# Patient Record
Sex: Female | Born: 1955
Health system: Southern US, Community
[De-identification: ages and names within clinical notes are randomized; demographics above are authoritative.]

## PROBLEM LIST (undated history)

## (undated) DIAGNOSIS — I10 Essential (primary) hypertension: Secondary | ICD-10-CM

---

## 1999-11-18 ENCOUNTER — Other Ambulatory Visit: Admission: RE | Admit: 1999-11-18 | Discharge: 1999-11-18 | Payer: Self-pay | Admitting: Family Medicine

## 2004-01-20 ENCOUNTER — Ambulatory Visit (HOSPITAL_COMMUNITY): Admission: RE | Admit: 2004-01-20 | Discharge: 2004-01-20 | Payer: Self-pay | Admitting: Gastroenterology

## 2004-01-20 ENCOUNTER — Encounter (INDEPENDENT_AMBULATORY_CARE_PROVIDER_SITE_OTHER): Payer: Self-pay | Admitting: *Deleted

## 2006-02-17 ENCOUNTER — Emergency Department (HOSPITAL_COMMUNITY): Admission: EM | Admit: 2006-02-17 | Discharge: 2006-02-17 | Payer: Self-pay | Admitting: Family Medicine

## 2007-03-01 ENCOUNTER — Emergency Department (HOSPITAL_COMMUNITY): Admission: EM | Admit: 2007-03-01 | Discharge: 2007-03-01 | Payer: Self-pay | Admitting: Emergency Medicine

## 2010-08-13 NOTE — Op Note (Signed)
Caitlin Lawson, Caitlin Lawson                  ACCOUNT NO.:  [id number]   MEDICAL RECORD NO.:  [id number]          PATIENT TYPE:  AMB   LOCATION:  ENDO                         FACILITY:  [facility]   PHYSICIAN:  Jordan Hawks. Elnoria Howard, MD    DATE OF BIRTH:  Nov 16, 1955   DATE OF PROCEDURE:  01/20/2004  DATE OF DISCHARGE:                                 OPERATIVE REPORT   PROCEDURE:  Colonoscopy.   ENDOSCOPIST:  Jordan Hawks. Elnoria Howard, M.D.   INSTRUMENT USED:  Olympus adult video colonoscope.   INDICATIONS:  Rectal bleeding and iron deficiency anemia.   CONSENT:  Informed consent was obtained from the patient describing the  risks of bleeding, infection, medication reaction, the 10% missed rate for a  small colon cancer and death, all of which are not exclusive of any other  complications that can occur.   PREPROCEDURE PHYSICAL EXAMINATION:  LUNGS:  Clear to auscultation  bilaterally.  CARDIOVASCULAR:  Regular rate and rhythm.  ABDOMEN:  Obese, soft, nontender, nondistended.   MEDICATIONS:  Versed 10 mg IV, Demerol 100 mg IV.   DESCRIPTION OF PROCEDURE:  After adequate sedation was achieved, while in  the left lateral decubitus position, a rectal examination was performed. On  inspection of the external anal canal, the patient was noted to have several  large anal skin tags.  The rectal examination was negative for any palpable  abnormalities.  The colonoscope was then advanced under direct visualization  to the terminal ileum and further documentation of the terminal ileum and  cecum were obtained.  Upon withdrawal of the colonoscope, the patient is  noted to have an excellent prep.  While at the hepatic flexure, a 3 mm  sessile polyp was identified and a cold biopsy was obtained.  Throughout the  entire colon, there was no evidence of any masses, polyps, inflammation,  ulcerations, erosions, vascular abnormalities or diverticulosis aside from  the polyp that was just described at the hepatic flexure.  Upon  withdrawal  of the colonoscope into the rectum, with retroflexion, the patient was noted  to have mild external hemorrhoids but no other abnormalities were detected.  The colonoscope was then straightened and withdrawn from the patient and the  procedure was terminated.  No complications were encountered.  The patient  was noted to have mild external hemorrhoids but no other abnormalities were  detected.  The colonoscope was then straightened and withdrawn from  the patient and the procedure was terminated.  No complications were  encountered.  The patient tolerated the procedure well.  The plan at this  time is to follow up on biopsies and to proceed with an EGD for iron  deficiency anemia.       PDH/MEDQ  D:  01/20/2004  T:  01/20/2004  Job:  [id number]   cc:   Talmadge Coventry, M.D.  [address]  [place]  [place] [address]  Fax: [phone]

## 2010-08-13 NOTE — Op Note (Signed)
Caitlin Lawson                  ACCOUNT NO.:  [id number]   MEDICAL RECORD NO.:  [id number]          PATIENT TYPE:  AMB   LOCATION:  ENDO                         FACILITY:  [facility]   PHYSICIAN:  Jordan Hawks. Elnoria Howard, MD    DATE OF BIRTH:  1956-02-16   DATE OF PROCEDURE:  01/20/2004  DATE OF DISCHARGE:                                 OPERATIVE REPORT   PROCEDURE:  Esophagogastroduodenoscopy.   ENDOSCOPIST:  Jordan Hawks. Elnoria Howard, M.D.   INDICATION:  Iron deficiency anemia.   INFORMED CONSENT:  Consent was obtained describing the risks of bleeding,  infection, perforation, medication reactions and the risk of death.   EQUIPMENT USED:  An adult Olympus endoscope.   PHYSICAL EXAM:  Please see the colonoscopy report.   MEDICATIONS:  None used during this procedure.   PROCEDURE:  After the colonoscopy was performed, the patient was positioned  for the upper endoscopy.  The endoscope was advanced from the oral cavity  into the proximal duodenum under direct visualization.  Several small random  biopsies were obtained of the duodenum and upon withdrawal of the endoscope  into the duodenal bulb, the patient was noted to have duodenitis as well as  a healing duodenal bulb ulcer.  There was no evidence of any bleeding.  The  endoscope was then withdrawn into the gastric lumen and there were no  abnormalities detected in the gastric mucosa, no evidence of any overt  inflammation, ulcerations, masses or vascular abnormalities.  Retroflexion  was negative for any abnormalities in the cardia region.  The endoscope was  then straightened and withdrawn into the esophagus.  The patient was noted  to have an irregular Z-line at 40 cm.  There was no evidence of active  esophagitis.  The endoscope was then withdrawn from the patient and the  procedure was terminated.  The patient tolerated the procedure well and no  complications were encountered.   PLAN:  Plan at this time is to follow up on the small  bowel biopsies and  also the CLO biopsies that were obtained in the stomach, and there may be  consideration for a repeat endoscopy in regards to evaluation of possible  Barrett's esophagus.  The patient will most likely require to be on high-  dose PPI for a duration before a repeat endoscopy can be performed.       PDH/MEDQ  D:  01/20/2004  T:  01/20/2004  Job:  [id number]   cc:   Talmadge Coventry, M.D.  [address]  [place]  [place] [address]  Fax: [phone]

## 2012-11-07 ENCOUNTER — Other Ambulatory Visit: Payer: Self-pay | Admitting: Family Medicine

## 2012-11-07 ENCOUNTER — Other Ambulatory Visit (HOSPITAL_COMMUNITY)
Admission: RE | Admit: 2012-11-07 | Discharge: 2012-11-07 | Disposition: A | Discharge status: Home / Self Care | Payer: 59 | Source: Ambulatory Visit | Attending: Family Medicine | Admitting: Family Medicine

## 2012-11-07 DIAGNOSIS — Z01419 Encounter for gynecological examination (general) (routine) without abnormal findings: Secondary | ICD-10-CM | POA: Insufficient documentation

## 2012-11-07 DIAGNOSIS — Z1151 Encounter for screening for human papillomavirus (HPV): Secondary | ICD-10-CM | POA: Insufficient documentation

## 2013-04-08 ENCOUNTER — Other Ambulatory Visit: Payer: Self-pay | Admitting: Family Medicine

## 2013-04-08 ENCOUNTER — Ambulatory Visit
Admission: RE | Admit: 2013-04-08 | Discharge: 2013-04-08 | Disposition: A | Discharge status: Home / Self Care | Payer: 59 | Source: Ambulatory Visit | Attending: Family Medicine | Admitting: Family Medicine

## 2013-04-08 DIAGNOSIS — M1250 Traumatic arthropathy, unspecified site: Secondary | ICD-10-CM

## 2013-11-12 ENCOUNTER — Other Ambulatory Visit (HOSPITAL_COMMUNITY)
Admission: RE | Admit: 2013-11-12 | Discharge: 2013-11-12 | Disposition: A | Discharge status: Home / Self Care | Payer: 59 | Source: Ambulatory Visit | Attending: Family Medicine | Admitting: Family Medicine

## 2013-11-12 ENCOUNTER — Other Ambulatory Visit: Payer: Self-pay | Admitting: Family Medicine

## 2013-11-12 DIAGNOSIS — Z1151 Encounter for screening for human papillomavirus (HPV): Secondary | ICD-10-CM | POA: Diagnosis present

## 2013-11-12 DIAGNOSIS — Z01419 Encounter for gynecological examination (general) (routine) without abnormal findings: Secondary | ICD-10-CM | POA: Insufficient documentation

## 2013-11-13 LAB — CYTOLOGY - PAP

## 2014-07-06 ENCOUNTER — Ambulatory Visit (HOSPITAL_BASED_OUTPATIENT_CLINIC_OR_DEPARTMENT_OTHER): Payer: 59 | Attending: Family Medicine

## 2014-07-06 VITALS — Ht 62.0 in | Wt 245.0 lb

## 2014-07-06 DIAGNOSIS — G4733 Obstructive sleep apnea (adult) (pediatric): Secondary | ICD-10-CM | POA: Diagnosis not present

## 2014-07-06 DIAGNOSIS — R0683 Snoring: Secondary | ICD-10-CM | POA: Diagnosis not present

## 2014-07-06 DIAGNOSIS — I119 Hypertensive heart disease without heart failure: Secondary | ICD-10-CM

## 2014-07-06 DIAGNOSIS — G4710 Hypersomnia, unspecified: Secondary | ICD-10-CM | POA: Diagnosis present

## 2014-07-12 DIAGNOSIS — I119 Hypertensive heart disease without heart failure: Secondary | ICD-10-CM | POA: Diagnosis not present

## 2014-07-12 NOTE — Sleep Study (Signed)
   NAME: Caitlin PetrinRosa L Degen DATE OF BIRTH:  [birth year] MEDICAL RECORD NUMBER [id number]  LOCATION: [facility]  PHYSICIAN: YOUNG,CLINTON D  DATE OF STUDY: 07/06/2014  SLEEP STUDY TYPE: Nocturnal Polysomnogram               REFERRING PHYSICIAN: Renaye RakersBland, Veita, MD  INDICATION FOR STUDY: Hypersomnia with sleep apnea  EPWORTH SLEEPINESS SCORE:   4/24 HEIGHT: 5\' 2"  (157.5 cm)  WEIGHT: 245 lb (111.131 kg)    Body mass index is 44.8 kg/(m^2).  NECK SIZE: 16 in.  MEDICATIONS: Charted for review  SLEEP ARCHITECTURE: Total sleep time 355 minutes with sleep efficiency 95.7%. Stage I was 4.5%, stage II 83.9%, stage III 0.1%, REM 11.4% of total sleep time. Sleep latency 7 minutes, REM latency 134 minutes, awake after sleep onset 9 minutes, arousal index 0.8, bedtime medication: None  RESPIRATORY DATA: Apnea hypopnea index (AHI) 9.3 per hour. 55 total events scored including 2 obstructive apneas and 53 hypopneas. All events were while supine. REM AHI 43 per hour. There were not enough early events to permit application of split protocol CPAP titration.  OXYGEN DATA: Loud snoring with oxygen desaturation to a nadir of 75% and mean saturation 91.6% on room air  CARDIAC DATA: Sinus rhythm with occasional PAC  MOVEMENT/PARASOMNIA: No significant movement disturbance, no bathroom trips  IMPRESSION/ RECOMMENDATION:   1) Mild obstructive sleep apnea/hypopnea syndrome, AHI 9.3 per hour with supine events. REM AHI 43 per hour. Loud snoring with oxygen desaturation to a nadir of 75% and mean saturation 91.6% on room air. 2) Scores in this range may respond to conservative therapy including weight loss and encouragement to sleep off flat of back. On an individual basis, CPAP or an oral appliance may be appropriate. 3) The patient reported waking up choking from sleep in the home environment. This is not a common description for sleep apnea and suggests possible occasional reflux during sleep.    Waymon BudgeYOUNG,CLINTON D Diplomate, American Board of Sleep Medicine  ELECTRONICALLY SIGNED ON:  07/12/2014, 10:27 AM [facility] PH: [phone]   FX: [phone] ACCREDITED BY THE AMERICAN ACADEMY OF SLEEP MEDICINE

## 2016-05-19 DIAGNOSIS — H2513 Age-related nuclear cataract, bilateral: Secondary | ICD-10-CM | POA: Diagnosis not present

## 2016-05-19 DIAGNOSIS — H401122 Primary open-angle glaucoma, left eye, moderate stage: Secondary | ICD-10-CM | POA: Diagnosis not present

## 2016-05-19 DIAGNOSIS — H40011 Open angle with borderline findings, low risk, right eye: Secondary | ICD-10-CM | POA: Diagnosis not present

## 2016-05-28 DIAGNOSIS — E785 Hyperlipidemia, unspecified: Secondary | ICD-10-CM | POA: Diagnosis not present

## 2016-05-28 DIAGNOSIS — R7309 Other abnormal glucose: Secondary | ICD-10-CM | POA: Diagnosis not present

## 2016-05-28 DIAGNOSIS — I10 Essential (primary) hypertension: Secondary | ICD-10-CM | POA: Diagnosis not present

## 2016-06-02 DIAGNOSIS — H401122 Primary open-angle glaucoma, left eye, moderate stage: Secondary | ICD-10-CM | POA: Diagnosis not present

## 2016-07-18 DIAGNOSIS — H2513 Age-related nuclear cataract, bilateral: Secondary | ICD-10-CM | POA: Diagnosis not present

## 2016-07-18 DIAGNOSIS — H40011 Open angle with borderline findings, low risk, right eye: Secondary | ICD-10-CM | POA: Diagnosis not present

## 2016-07-18 DIAGNOSIS — H401122 Primary open-angle glaucoma, left eye, moderate stage: Secondary | ICD-10-CM | POA: Diagnosis not present

## 2016-10-19 DIAGNOSIS — Z1231 Encounter for screening mammogram for malignant neoplasm of breast: Secondary | ICD-10-CM | POA: Diagnosis not present

## 2016-12-31 DIAGNOSIS — Z23 Encounter for immunization: Secondary | ICD-10-CM | POA: Diagnosis not present

## 2017-01-17 DIAGNOSIS — H40011 Open angle with borderline findings, low risk, right eye: Secondary | ICD-10-CM | POA: Diagnosis not present

## 2017-01-17 DIAGNOSIS — H2513 Age-related nuclear cataract, bilateral: Secondary | ICD-10-CM | POA: Diagnosis not present

## 2017-01-17 DIAGNOSIS — H401122 Primary open-angle glaucoma, left eye, moderate stage: Secondary | ICD-10-CM | POA: Diagnosis not present

## 2017-04-15 DIAGNOSIS — E785 Hyperlipidemia, unspecified: Secondary | ICD-10-CM | POA: Diagnosis not present

## 2017-04-15 DIAGNOSIS — I10 Essential (primary) hypertension: Secondary | ICD-10-CM | POA: Diagnosis not present

## 2017-04-15 DIAGNOSIS — R7309 Other abnormal glucose: Secondary | ICD-10-CM | POA: Diagnosis not present

## 2017-06-19 DIAGNOSIS — M25561 Pain in right knee: Secondary | ICD-10-CM | POA: Diagnosis not present

## 2017-07-21 DIAGNOSIS — H2513 Age-related nuclear cataract, bilateral: Secondary | ICD-10-CM | POA: Diagnosis not present

## 2017-07-21 DIAGNOSIS — E785 Hyperlipidemia, unspecified: Secondary | ICD-10-CM | POA: Diagnosis not present

## 2017-07-21 DIAGNOSIS — H40011 Open angle with borderline findings, low risk, right eye: Secondary | ICD-10-CM | POA: Diagnosis not present

## 2017-07-21 DIAGNOSIS — H401122 Primary open-angle glaucoma, left eye, moderate stage: Secondary | ICD-10-CM | POA: Diagnosis not present

## 2017-07-21 DIAGNOSIS — R7309 Other abnormal glucose: Secondary | ICD-10-CM | POA: Diagnosis not present

## 2017-07-21 DIAGNOSIS — I10 Essential (primary) hypertension: Secondary | ICD-10-CM | POA: Diagnosis not present

## 2017-07-29 DIAGNOSIS — I10 Essential (primary) hypertension: Secondary | ICD-10-CM | POA: Diagnosis not present

## 2017-07-29 DIAGNOSIS — E785 Hyperlipidemia, unspecified: Secondary | ICD-10-CM | POA: Diagnosis not present

## 2017-07-29 DIAGNOSIS — M179 Osteoarthritis of knee, unspecified: Secondary | ICD-10-CM | POA: Diagnosis not present

## 2017-08-16 DIAGNOSIS — M1712 Unilateral primary osteoarthritis, left knee: Secondary | ICD-10-CM | POA: Diagnosis not present

## 2017-08-16 DIAGNOSIS — M1711 Unilateral primary osteoarthritis, right knee: Secondary | ICD-10-CM | POA: Diagnosis not present

## 2017-09-14 ENCOUNTER — Encounter (HOSPITAL_COMMUNITY): Payer: Self-pay | Admitting: Family Medicine

## 2017-09-14 ENCOUNTER — Ambulatory Visit (HOSPITAL_COMMUNITY)
Admission: EM | Admit: 2017-09-14 | Discharge: 2017-09-14 | Disposition: A | Discharge status: Home / Self Care | Payer: 59 | Attending: Family Medicine | Admitting: Family Medicine

## 2017-09-14 DIAGNOSIS — M25561 Pain in right knee: Secondary | ICD-10-CM | POA: Diagnosis not present

## 2017-09-14 HISTORY — DX: Essential (primary) hypertension: I10

## 2017-09-14 NOTE — Discharge Instructions (Addendum)
Start knee brace during activity. Physical therapy for strengthening thigh muscle.

## 2017-09-14 NOTE — ED Triage Notes (Signed)
Pt here for right knee pain and swelling . She received a cortisone shot from ortho in April. sts worse since the injection. She has been taking meloxicam also and helped.

## 2017-09-14 NOTE — ED Provider Notes (Signed)
[facility]    CSN: [id number] Arrival date & time: 09/14/17  1730     History   Chief Complaint Chief Complaint  Patient presents with  . Knee Pain    HPI Caitlin Lawson is a 62 y.o. female.   62 year old female comes in for right knee pain and swelling.  States that she was given a cortisone injection by Ortho in April which did not improve symptoms, and felt that it has worsened.  Pain is general to the right knee, worse with going up and down the stairs.  States she then tried meloxicam for about a month, with slight relief.  She was told to discontinue meloxicam given no significant improvement.  She denies injury/trauma.  States had x-ray showing arthritis.  She has continued to use topical analgesics without relief.  Denies erythema, increased warmth, fever.     Past Medical History:  Diagnosis Date  . Hypertension     There are no active problems to display for this patient.   History reviewed. No pertinent surgical history.  OB History   None      Home Medications    Prior to Admission medications   Medication Sig Start Date End Date Taking? Authorizing Provider  Olmesartan-amLODIPine-HCTZ (TRIBENZOR) 40-10-12.5 MG TABS Take by mouth.   Yes [provider]    Family History History reviewed. No pertinent family history.  Social History Social History   Tobacco Use  . Smoking status: Never Smoker  . Smokeless tobacco: Never Used  Substance Use Topics  . Alcohol use: Not on file  . Drug use: Not on file     Allergies   Patient has no known allergies.   Review of Systems Review of Systems  Reason unable to perform ROS: See HPI as above.     Physical Exam Triage Vital Signs ED Triage Vitals  Enc Vitals Group     BP 09/14/17 1753 126/84     Pulse Rate 09/14/17 1753 76     Resp 09/14/17 1753 18     Temp 09/14/17 1753 98.6 F (37 C)     Temp src --      SpO2 09/14/17 1753 98 %     Weight --      Height --    Head Circumference --      Peak Flow --      Pain Score 09/14/17 1752 4     Pain Loc --      Pain Edu? --      Excl. in GC? --    No data found.  Updated Vital Signs BP 126/84   Pulse 76   Temp 98.6 F (37 C)   Resp 18   LMP 03/25/2013   SpO2 98%   Visual Acuity Right Eye Distance:   Left Eye Distance:   Bilateral Distance:    Right Eye Near:   Left Eye Near:    Bilateral Near:     Physical Exam  Constitutional: She is oriented to person, place, and time. She appears well-developed and well-nourished. No distress.  HENT:  Head: Normocephalic and atraumatic.  Eyes: Pupils are equal, round, and reactive to light. Conjunctivae are normal.  Musculoskeletal:  No obvious swelling, erythema, increased warmth, contusion. No obvious tenderness to palpation of the knee. Full ROM of knee, with crepitus felt. Strength normal and equal bilaterally. Sensation intact and equal bilaterally. Tenderness when pressure applied to patellar while flexing hip.   Neurological: She is alert and  oriented to person, place, and time.     UC Treatments / Results  Labs (all labs ordered are listed, but only abnormal results are displayed) Labs Reviewed - No data to display  EKG None  Radiology No results found.  Procedures Procedures (including critical care time)  Medications Ordered in UC Medications - No data to display  Initial Impression / Assessment and Plan / UC Course  I have reviewed the triage vital signs and the nursing notes.  Pertinent labs & imaging results that were available during my care of the patient were reviewed by me and considered in my medical decision making (see chart for details).    Discussed case with Dr Milus GlazierLauenstein who also examined patient.  Patient exam suspicious for patellofemoral syndrome.  Will have patient wear knee brace, follow-up with physical therapy for further management needed.  Resources provided.  Return precautions given.  Patient  expresses understanding and agrees to plan.  Final Clinical Impressions(s) / UC Diagnoses   Final diagnoses:  Acute pain of right knee    ED Prescriptions    None        Belinda FisherYu, Orman Matsumura V, PA-C 09/14/17 1829

## 2017-10-24 DIAGNOSIS — Z1231 Encounter for screening mammogram for malignant neoplasm of breast: Secondary | ICD-10-CM | POA: Diagnosis not present

## 2017-12-28 DIAGNOSIS — R7309 Other abnormal glucose: Secondary | ICD-10-CM | POA: Diagnosis not present

## 2017-12-28 DIAGNOSIS — I10 Essential (primary) hypertension: Secondary | ICD-10-CM | POA: Diagnosis not present

## 2017-12-28 DIAGNOSIS — E785 Hyperlipidemia, unspecified: Secondary | ICD-10-CM | POA: Diagnosis not present

## 2017-12-30 DIAGNOSIS — R7309 Other abnormal glucose: Secondary | ICD-10-CM | POA: Diagnosis not present

## 2017-12-30 DIAGNOSIS — I10 Essential (primary) hypertension: Secondary | ICD-10-CM | POA: Diagnosis not present

## 2017-12-30 DIAGNOSIS — G4730 Sleep apnea, unspecified: Secondary | ICD-10-CM | POA: Diagnosis not present

## 2018-01-10 DIAGNOSIS — Z780 Asymptomatic menopausal state: Secondary | ICD-10-CM | POA: Diagnosis not present

## 2018-01-23 DIAGNOSIS — H401122 Primary open-angle glaucoma, left eye, moderate stage: Secondary | ICD-10-CM | POA: Diagnosis not present

## 2018-01-23 DIAGNOSIS — H2513 Age-related nuclear cataract, bilateral: Secondary | ICD-10-CM | POA: Diagnosis not present

## 2018-01-23 DIAGNOSIS — H40011 Open angle with borderline findings, low risk, right eye: Secondary | ICD-10-CM | POA: Diagnosis not present

## 2018-03-31 ENCOUNTER — Ambulatory Visit (HOSPITAL_COMMUNITY)
Admission: EM | Admit: 2018-03-31 | Discharge: 2018-03-31 | Disposition: A | Discharge status: Home / Self Care | Payer: 59 | Attending: Family Medicine | Admitting: Family Medicine

## 2018-03-31 ENCOUNTER — Encounter (HOSPITAL_COMMUNITY): Payer: Self-pay | Admitting: Emergency Medicine

## 2018-03-31 DIAGNOSIS — M25572 Pain in left ankle and joints of left foot: Secondary | ICD-10-CM

## 2018-03-31 DIAGNOSIS — M25472 Effusion, left ankle: Secondary | ICD-10-CM

## 2018-03-31 MED ORDER — MELOXICAM 7.5 MG PO TABS: 7.5000 mg | ORAL_TABLET | Freq: Every day | ORAL | 0 refills | Status: DC

## 2018-03-31 NOTE — ED Triage Notes (Signed)
Pt c/o L ankle swelling since yesterday. Denies injury.

## 2018-03-31 NOTE — Discharge Instructions (Signed)
Start Mobic. Do not take ibuprofen (motrin/advil)/ naproxen (aleve) while on mobic.  Ice compress, elevation, Ace wrap during activity.  Follow-up with PCP for further evaluation if symptoms not improving.  If noticing chest pain, shortness of breath, increased swelling, needing more pillows to sleep due to shortness of breath, follow-up at the emergency department for further evaluation needed.

## 2018-03-31 NOTE — ED Provider Notes (Signed)
[facility]    CSN: [id number] Arrival date & time: 03/31/18  1639     History   Chief Complaint Chief Complaint  Patient presents with  . Ankle Pain    HPI Caitlin Lawson is a 63 y.o. female.   63 year old female with history of hypertension comes in for 2-day history of left ankle pain with swelling.  Denies injury/trauma.  Denies erythema, warmth.  States pain worse with ambulation.  Denies numbness, tingling.  States swelling slightly improved overnight, but increased throughout the day.  Denies chest pain, shortness of breath, palpitation, orthopnea.  Denies history of heart disease, diabetes.  Denies long travels, history of blood clots.     Past Medical History:  Diagnosis Date  . Hypertension     There are no active problems to display for this patient.   History reviewed. No pertinent surgical history.  OB History   No obstetric history on file.      Home Medications    Prior to Admission medications   Medication Sig Start Date End Date Taking? Authorizing Provider  meloxicam (MOBIC) 7.5 MG tablet Take 1 tablet (7.5 mg total) by mouth daily. 03/31/18   Cathie HoopsYu,  V, PA-C  Olmesartan-amLODIPine-HCTZ (TRIBENZOR) 40-10-12.5 MG TABS Take by mouth.    [provider]    Family History No family history on file.  Social History Social History   Tobacco Use  . Smoking status: Never Smoker  . Smokeless tobacco: Never Used  Substance Use Topics  . Alcohol use: Not on file  . Drug use: Not on file     Allergies   Patient has no known allergies.   Review of Systems Review of Systems  Reason unable to perform ROS: See HPI as above.     Physical Exam Triage Vital Signs ED Triage Vitals  Enc Vitals Group     BP 03/31/18 1733 139/83     Pulse Rate 03/31/18 1733 94     Resp 03/31/18 1733 13     Temp 03/31/18 1733 98.4 F (36.9 C)     Temp src --      SpO2 03/31/18 1733 99 %     Weight --      Height --      Head Circumference  --      Peak Flow --      Pain Score 03/31/18 1735 9     Pain Loc --      Pain Edu? --      Excl. in GC? --    No data found.  Updated Vital Signs BP 139/83   Pulse 94   Temp 98.4 F (36.9 C)   Resp 13   LMP 03/25/2013   SpO2 99%   Physical Exam Constitutional:      General: She is not in acute distress.    Appearance: She is well-developed. She is not diaphoretic.  HENT:     Head: Normocephalic and atraumatic.  Eyes:     Conjunctiva/sclera: Conjunctivae normal.     Pupils: Pupils are equal, round, and reactive to light.  Musculoskeletal:     Comments: Swelling to the left ankle with mild pitting edema of distal left lower leg. No erythema, warmth. Tenderness to palpation of lateral and medial ankle. Full ROM of ankle and toes. Strength normal and equal bilaterally. Sensation intact and equal bilaterally. Pedal pulse 2+, cap refill<2s.  No swelling of the calf. Negative homan's.   Neurological:     Mental  Status: She is alert and oriented to person, place, and time.      UC Treatments / Results  Labs (all labs ordered are listed, but only abnormal results are displayed) Labs Reviewed - No data to display  EKG None  Radiology No results found.  Procedures Procedures (including critical care time)  Medications Ordered in UC Medications - No data to display  Initial Impression / Assessment and Plan / UC Course  I have reviewed the triage vital signs and the nursing notes.  Pertinent labs & imaging results that were available during my care of the patient were reviewed by me and considered in my medical decision making (see chart for details).    Case discussed with Dr Tracie Harrier. Patient with one sided leg swelling, history and exam not consistent with DVT. Will provide symptomatic treatment, ace wrap, elevation of foot. Strict return precautions given. Patient expresses understanding and agrees to plan.  Case discussed with Dr Tracie Harrier, who agrees to  plan.  Final Clinical Impressions(s) / UC Diagnoses   Final diagnoses:  Acute left ankle pain  Left ankle swelling    ED Prescriptions    Medication Sig Dispense Auth. Provider   meloxicam (MOBIC) 7.5 MG tablet Take 1 tablet (7.5 mg total) by mouth daily. 15 tablet Threasa Alpha, [place] 03/31/18 2054

## 2018-05-10 DIAGNOSIS — R7309 Other abnormal glucose: Secondary | ICD-10-CM | POA: Diagnosis not present

## 2018-05-10 DIAGNOSIS — I10 Essential (primary) hypertension: Secondary | ICD-10-CM | POA: Diagnosis not present

## 2018-05-10 DIAGNOSIS — E785 Hyperlipidemia, unspecified: Secondary | ICD-10-CM | POA: Diagnosis not present

## 2018-05-12 DIAGNOSIS — G4730 Sleep apnea, unspecified: Secondary | ICD-10-CM | POA: Diagnosis not present

## 2018-05-12 DIAGNOSIS — E785 Hyperlipidemia, unspecified: Secondary | ICD-10-CM | POA: Diagnosis not present

## 2018-05-12 DIAGNOSIS — I10 Essential (primary) hypertension: Secondary | ICD-10-CM | POA: Diagnosis not present

## 2020-10-29 ENCOUNTER — Encounter (HOSPITAL_COMMUNITY): Payer: Self-pay

## 2020-10-29 ENCOUNTER — Other Ambulatory Visit: Payer: Self-pay

## 2020-10-29 ENCOUNTER — Ambulatory Visit (HOSPITAL_COMMUNITY)
Admission: EM | Admit: 2020-10-29 | Discharge: 2020-10-29 | Disposition: A | Discharge status: Home / Self Care | Payer: 59 | Attending: Family Medicine | Admitting: Family Medicine

## 2020-10-29 DIAGNOSIS — M109 Gout, unspecified: Secondary | ICD-10-CM | POA: Diagnosis not present

## 2020-10-29 MED ORDER — INDOMETHACIN 50 MG PO CAPS: 50.0000 mg | ORAL_CAPSULE | Freq: Three times a day (TID) | ORAL | 0 refills | Status: AC

## 2020-10-29 MED ORDER — COLCHICINE 0.6 MG PO TABS: ORAL_TABLET | ORAL | 0 refills | Status: AC

## 2020-10-29 NOTE — ED Provider Notes (Signed)
  [facility]   [id number] 10/29/20 Arrival Time: 1654  ASSESSMENT & PLAN:  1. Podagra    Begin: Meds ordered this encounter  Medications   colchicine 0.6 MG tablet    Sig: Take two tablets as one dose followed one hour later by one tablet.    Dispense:  3 tablet    Refill:  0   indomethacin (INDOCIN) 50 MG capsule    Sig: Take 1 capsule (50 mg total) by mouth 3 (three) times daily with meals.    Dispense:  15 capsule    Refill:  0   WBAT.  Recommend:  Follow-up Information     Renaye Rakers, MD.   Specialty: Family Medicine Why: As needed. Contact information: [address] [place] [address] [phone]         [facility].   Specialty: Urgent Care Why: If worsening or failing to improve as anticipated. Contact information: [address] [place] [address] [phone]               Reviewed expectations re: course of current medical issues. Questions answered. Outlined signs and symptoms indicating need for more acute intervention. Patient verbalized understanding. After Visit Summary given.  SUBJECTIVE: History from: patient. Caitlin Lawson is a 65 y.o. female who reports persistent R great toe pain; x 2-3 days; feels this is gout; h/o. No injury. Pain increasing. No extremity sensation changes or weakness.  No tx PTA.   OBJECTIVE:  Vitals:   10/29/20 1751 10/29/20 1753  BP:  122/61  Pulse: 65   Resp: 20   Temp: 98.5 F (36.9 C)   TempSrc: Oral   SpO2: 100%     General appearance: alert; no distress HEENT: [place]; AT Neck: supple with FROM Resp: unlabored respirations Extremities: RLE: warm with well perfused appearance; well localized marked tenderness over right 1st MTP; bunion deformity present; swelling: minimal; bruising: none CV: brisk extremity capillary refill of RLE; 2+ DP pulse of RLE. Skin: warm and dry; no visible rashes Neurologic: gait normal; normal sensation and  strength of RLE Psychological: alert and cooperative; normal mood and affect    No Known Allergies  Past Medical History:  Diagnosis Date   Hypertension    Social History   Socioeconomic History   Marital status: Single    Spouse name: Not on file   Number of children: Not on file   Years of education: Not on file   Highest education level: Not on file  Occupational History   Not on file  Tobacco Use   Smoking status: Never   Smokeless tobacco: Never  Substance and Sexual Activity   Alcohol use: Not on file   Drug use: Not on file   Sexual activity: Not on file  Other Topics Concern   Not on file  Social History Narrative   Not on file   Social Determinants of Health   Financial Resource Strain: Not on file  Food Insecurity: Not on file  Transportation Needs: Not on file  Physical Activity: Not on file  Stress: Not on file  Social Connections: Not on file   History reviewed. No pertinent family history. History reviewed. No pertinent surgical history.     Mardella Layman, MD 10/29/20 Zollie Pee

## 2020-10-29 NOTE — ED Triage Notes (Signed)
Pt in with c/o possible gout flare up in right foot and toes x Tuesday

## 2021-04-28 ENCOUNTER — Other Ambulatory Visit: Payer: Self-pay

## 2021-04-28 ENCOUNTER — Encounter (HOSPITAL_COMMUNITY): Payer: Self-pay

## 2021-04-28 ENCOUNTER — Ambulatory Visit (HOSPITAL_COMMUNITY)
Admission: EM | Admit: 2021-04-28 | Discharge: 2021-04-28 | Disposition: A | Discharge status: Home / Self Care | Payer: 59 | Attending: Family Medicine | Admitting: Family Medicine

## 2021-04-28 DIAGNOSIS — M109 Gout, unspecified: Secondary | ICD-10-CM

## 2021-04-28 MED ORDER — PREDNISONE 10 MG PO TABS: ORAL_TABLET | ORAL | 0 refills | Status: AC

## 2021-04-28 NOTE — ED Triage Notes (Signed)
Pt presents with c/o L foot pain x 5 days.  States she think gout has flared up. States she has been without her BP meds for 5 days.

## 2021-04-28 NOTE — Discharge Instructions (Signed)
As we discussed, it does seem that you have a gout flare.  I have sent a prescription to the pharmacy for a steroid that should help with this.  I recommend following up with your regular doctor to discuss the possibility of starting a controller given your recurrent flares.  In regards to your blood pressure, please make sure that you get a refill from your primary care provider.  If you experience chest pain, difficulty breathing, weakness on one side of your body or the other, difficulty urinating, changes in your vision, you should be seen at the emergency room right away.

## 2021-04-28 NOTE — ED Provider Notes (Signed)
[facility]    CSN: [id number] Arrival date & time: 04/28/21  0807      History   Chief Complaint Chief Complaint  Patient presents with   Foot Pain    HPI Caitlin Lawson is a 66 y.o. female.   Left Foot Pain Reports a history of gout with prior flare Left first MTP pain Reports it is red and swollen Feels that it is very tender on the dorsal aspect of her foot Denies any injury Denies any fevers or chills Otherwise in her normal state of health She denies a history of diabetes She is not currently on a controller to her knowledge  Hypertension Has been without medications for 5 days Has called her doctor and her pharmacy to get a refill She states that she is planning to check today to see if it is there and if not she will call her primary care provider again She was last seen by her primary care provider about a month ago and has another appointment in a month Denies any chest pain, difficulty breathing, focal weakness, numbness and tingling, difficulty urinating, changes in vision   Past Medical History:  Diagnosis Date   Hypertension     There are no problems to display for this patient.   History reviewed. No pertinent surgical history.  OB History   No obstetric history on file.      Home Medications    Prior to Admission medications   Medication Sig Start Date End Date Taking? Authorizing Provider  predniSONE (DELTASONE) 10 MG tablet Take 6 tablets (60 mg total) by mouth daily for 1 day, THEN 5 tablets (50 mg total) daily for 1 day, THEN 4 tablets (40 mg total) daily for 1 day, THEN 3 tablets (30 mg total) daily for 1 day, THEN 2 tablets (20 mg total) daily for 1 day, THEN 1 tablet (10 mg total) daily for 1 day. 04/28/21 05/04/21 Yes Lorna Strother, Solmon Ice, DO  colchicine 0.6 MG tablet Take two tablets as one dose followed one hour later by one tablet. 10/29/20   Mardella Layman, MD  indomethacin (INDOCIN) 50 MG capsule Take 1 capsule (50 mg total)  by mouth 3 (three) times daily with meals. 10/29/20   Mardella Layman, MD  Olmesartan-amLODIPine-HCTZ [facility]) 40-10-12.5 MG TABS Take by mouth.    [provider]    Family History History reviewed. No pertinent family history.  Social History Social History   Tobacco Use   Smoking status: Never   Smokeless tobacco: Never     Allergies   Patient has no known allergies.   Review of Systems Review of Systems  All other systems reviewed and are negative.  Per HPI Physical Exam Triage Vital Signs ED Triage Vitals  Enc Vitals Group     BP      Pulse      Resp      Temp      Temp src      SpO2      Weight      Height      Head Circumference      Peak Flow      Pain Score      Pain Loc      Pain Edu?      Excl. in GC?    No data found.  Updated Vital Signs BP (!) 141/96 (BP Location: Left Arm)    Pulse (!) 105    Resp 16    LMP  03/25/2013    SpO2 97%   Visual Acuity Right Eye Distance:   Left Eye Distance:   Bilateral Distance:    Right Eye Near:   Left Eye Near:    Bilateral Near:     Physical Exam Constitutional:      General: She is not in acute distress.    Appearance: Normal appearance. She is not ill-appearing.  HENT:     Head: Normocephalic and atraumatic.  Eyes:     Conjunctiva/sclera: Conjunctivae normal.  Cardiovascular:     Rate and Rhythm: Normal rate and regular rhythm.     Heart sounds: No murmur heard.   No friction rub. No gallop.  Pulmonary:     Effort: Pulmonary effort is normal. No respiratory distress.     Breath sounds: No wheezing, rhonchi or rales.  Musculoskeletal:     Cervical back: Normal range of motion.     Comments: Left Foot: Inspection:  Prominent 1st MTP with hallux valgus, mild erythema at first MTP Palpation: Tenderness to light palpation over dorsal aspect of 1st MTP ROM: Full  ROM of the ankle b/l. Normal midfoot flexibility b/l Strength: 5/5 strength ankle in all planes b/l Neurovascular: N/V intact  distally in the lower extremity b/l   Skin:    General: Skin is warm and dry.  Neurological:     Mental Status: She is alert and oriented to person, place, and time.  Psychiatric:        Mood and Affect: Mood normal.        Behavior: Behavior normal.     UC Treatments / Results  Labs (all labs ordered are listed, but only abnormal results are displayed) Labs Reviewed - No data to display  EKG   Radiology No results found.  Procedures Procedures (including critical care time)  Medications Ordered in UC Medications - No data to display  Initial Impression / Assessment and Plan / UC Course  I have reviewed the triage vital signs and the nursing notes.  Pertinent labs & imaging results that were available during my care of the patient were reviewed by me and considered in my medical decision making (see chart for details).     History and examination consistent with acute gout flare given history.  Given that she has been off of her blood pressure medicine, would prefer glucocorticoid versus an NSAID or colchicine as we are unsure of her kidney function.  Rx sent for prednisone.  Recommend follow-up with primary care provider to discuss possible initiation of allopurinol.  In regards to her hypertension she is asymptomatic today.  This seems to be under control by her primary care provider and she will be getting a refill today.  Given ED precautions, see AVS.   Final Clinical Impressions(s) / UC Diagnoses   Final diagnoses:  Acute gout involving toe of left foot, unspecified cause     Discharge Instructions      As we discussed, it does seem that you have a gout flare.  I have sent a prescription to the pharmacy for a steroid that should help with this.  I recommend following up with your regular doctor to discuss the possibility of starting a controller given your recurrent flares.  In regards to your blood pressure, please make sure that you get a refill from your  primary care provider.  If you experience chest pain, difficulty breathing, weakness on one side of your body or the other, difficulty urinating, changes in your vision, you should  be seen at the emergency room right away.     ED Prescriptions     Medication Sig Dispense Auth. Provider   predniSONE (DELTASONE) 10 MG tablet Take 6 tablets (60 mg total) by mouth daily for 1 day, THEN 5 tablets (50 mg total) daily for 1 day, THEN 4 tablets (40 mg total) daily for 1 day, THEN 3 tablets (30 mg total) daily for 1 day, THEN 2 tablets (20 mg total) daily for 1 day, THEN 1 tablet (10 mg total) daily for 1 day. 21 tablet Avant Printy, Solmon Ice, DO      PDMP not reviewed this encounter.   Ryzen Deady, Solmon Ice, DO 04/28/21 [phone]

## 2021-07-11 NOTE — Progress Notes (Signed)
?Cardiology Office Note:   ? ?Date:  07/12/2021  ? ?Caitlin Lawson, DOB February 15, 1956, MRN [id number] ? ?PCP:  Renaye Rakers, MD  ?Cardiologist:  Little Ishikawa, MD  ?Electrophysiologist:  None  ? ?Referring MD: Renaye Rakers, MD  ? ?Chief Complaint  ?Patient presents with  ? Palpitations  ? ? ?History of Present Illness:   ? ?Caitlin Lawson is a 66 y.o. female with a hx of gout, hypertension, hyperlipidemia who was referred by Dr. Parke Simmers for evaluation of abnormal EKG.  Denies any chest pain, dyspnea, or palpitations.  She reports occasional lightheadedness but denies any syncope.  Reports occasional left lower extremity edema.  Reports has been having palpitations about once every 2 weeks.  Last for few minutes and feels like heart is racing.  Can feel lightheaded during episodes.  She walks on treadmill once per week for 15 minutes.  Denies any exertional symptoms.  Reports had positive sleep study 5 years ago but did not start CPAP.  Does report her husband has told her she snores and has had observed apnea events.  No smoking history.  Family history includes paternal grandmother had CVA. ? ? ? ?Past Medical History:  ?Diagnosis Date  ? Hypertension   ? ? ?No past surgical history on file. ? ?Current Medications: ?Current Meds  ?Medication Sig  ? colchicine 0.6 MG tablet Take two tablets as one dose followed one hour later by one tablet.  ? indomethacin (INDOCIN) 50 MG capsule Take 1 capsule (50 mg total) by mouth 3 (three) times daily with meals.  ? Olmesartan-amLODIPine-HCTZ 40-10-12.5 MG TABS Take by mouth.  ?  ? ?Allergies:   Patient has no known allergies.  ? ?Social History  ? ?Socioeconomic History  ? Marital status: Single  ?  Spouse name: Not on file  ? Number of children: Not on file  ? Years of education: Not on file  ? Highest education level: Not on file  ?Occupational History  ? Not on file  ?Tobacco Use  ? Smoking status: Never  ? Smokeless tobacco: Never  ?Substance and Sexual Activity  ?  Alcohol use: Not on file  ? Drug use: Not on file  ? Sexual activity: Not on file  ?Other Topics Concern  ? Not on file  ?Social History Narrative  ? Not on file  ? ?Social Determinants of Health  ? ?Financial Resource Strain: Not on file  ?Food Insecurity: Not on file  ?Transportation Needs: Not on file  ?Physical Activity: Not on file  ?Stress: Not on file  ?Social Connections: Not on file  ?  ? ?Family History: ?Family history includes paternal grandmother had CVA. ? ?ROS:   ?Please see the history of present illness.    ? All other systems reviewed and are negative. ? ?EKGs/Labs/Other Studies Reviewed:   ? ?The following studies were reviewed today: ? ?EKG:  ?07/12/21: Normal sinus rhythm, LVH, poor R wave progression ? ?Recent Labs: ?No results found for requested labs within last 8760 hours.  ?Recent Lipid Panel ?No results found for: CHOL, TRIG, HDL, CHOLHDL, VLDL, LDLCALC, LDLDIRECT ? ?Physical Exam:   ? ?VS:  BP 118/68   Pulse 81   Ht 5\' 2"  (1.575 m)   Wt 235 lb 3.2 oz (106.7 kg)   LMP 03/25/2013   SpO2 97%   BMI 43.02 kg/m?    ? ?Wt Readings from Last 3 Encounters:  ?07/12/21 235 lb 3.2 oz (106.7 kg)  ?07/06/14 245 lb (111.1  kg)  ?  ? ?GEN:  Well nourished, well developed in no acute distress ?HEENT: Normal ?NECK: No JVD; No carotid bruits ?LYMPHATICS: No lymphadenopathy ?CARDIAC: RRR, no murmurs, rubs, gallops ?RESPIRATORY:  Clear to auscultation without rales, wheezing or rhonchi  ?ABDOMEN: Soft, non-tender, non-distended ?MUSCULOSKELETAL: Trace edema ?SKIN: Warm and dry ?NEUROLOGIC:  Alert and oriented x 3 ?PSYCHIATRIC:  Normal affect  ? ?ASSESSMENT:   ? ?1. Nonspecific abnormal electrocardiogram (ECG) (EKG)   ?2. Palpitations   ?3. Hyperlipidemia, unspecified hyperlipidemia type   ?4. Essential hypertension   ?5. OSA (obstructive sleep apnea)   ? ?PLAN:   ? ?Abnormal EKG: LVH, poor R wave progression.  Recommend echocardiogram for further evaluation ? ?Palpitations: Description concerning for  arrhythmia, recommend Zio patch x2 weeks  ? ?Hyperlipidemia: LDL 120 on 06/09/2021.  10-year ASCVD risk score 7.1%, borderline for needing statin.  Recommend calcium score for further risk stratification ? ?Hypertension: On olmesartan-amlodipine-HCTZ 40-10-25 mg daily.  Appears controlled ? ?OSA: Reports was diagnosed with sleep apnea 5 years ago but did not start CPAP.  She states she has been told she snores and has had observed apnea events.  Recommend home sleep study ? ?RTC in 3 to 4 months ? ?Medication Adjustments/Labs and Tests Ordered: ?Current medicines are reviewed at length with the patient today.  Concerns regarding medicines are outlined above.  ?Orders Placed This Encounter  ?Procedures  ? CT CARDIAC SCORING (SELF PAY ONLY)  ? LONG TERM MONITOR (3-14 DAYS)  ? EKG 12-Lead  ? ECHOCARDIOGRAM COMPLETE  ? Home sleep test  ? ?No orders of the defined types were placed in this encounter. ? ? ?Patient Instructions  ?Medication Instructions:  ?No changes ?*If you need a refill on your cardiac medications before your next appointment, please call your pharmacy* ? ? ?Lab Work: ?None ordered ?If you have labs (blood work) drawn today and your tests are completely normal, you will receive your results only by: ?MyChart Message (if you have MyChart) OR ?A paper copy in the mail ?If you have any lab test that is abnormal or we need to change your treatment, we will call you to review the results. ? ? ?Testing/Procedures: ?Your physician has requested that you have an echocardiogram. Echocardiography is a painless test that uses sound waves to create images of your heart. It provides your doctor with information about the size and shape of your heart and how well your heart?s chambers and valves are working. You may receive an ultrasound enhancing agent through an IV if needed to better visualize your heart during the echo.This procedure takes approximately one hour. There are no restrictions for this procedure. This  will take place at the [address], [address].  ? ?Your physician has recommended that you have a home sleep study. This test records several body functions during sleep, including: brain activity, eye movement, oxygen and carbon dioxide blood levels, heart rate and rhythm, breathing rate and rhythm, the flow of air through your mouth and nose, snoring, body muscle movements, and chest and belly movement. ? ?Dr. Bjorn Pippin has ordered a CT coronary calcium score.  ? ?Test locations:  ?[facility] ([address] 3rd Floor [place], [place] [address]) ?[facility] ([address] [place], [place] [address])  ? ?This is $99 out of pocket. ? ? ?Coronary CalciumScan ?A coronary calcium scan is an imaging test used to look for deposits of calcium and other fatty materials (plaques) in the inner lining of the blood vessels of  the heart (coronary arteries). These deposits of calcium and plaques can partly clog and narrow the coronary arteries without producing any symptoms or warning signs. This puts a person at risk for a heart attack. This test can detect these deposits before symptoms develop. ?Tell a health care provider about: ?Any allergies you have. ?All medicines you are taking, including vitamins, herbs, eye drops, creams, and over-the-counter medicines. ?Any problems you or family members have had with anesthetic medicines. ?Any blood disorders you have. ?Any surgeries you have had. ?Any medical conditions you have. ?Whether you are pregnant or may be pregnant. ?What are the risks? ?Generally, this is a safe procedure. However, problems may occur, including: ?Harm to a pregnant woman and her unborn baby. This test involves the use of radiation. Radiation exposure can be dangerous to a pregnant woman and her unborn baby. If you are pregnant, you generally should not have this procedure done. ?Slight increase in the risk of cancer. This is because of the radiation involved in the test. ?What happens  before the procedure? ?No preparation is needed for this procedure. ?What happens during the procedure? ?You will undress and remove any jewelry around your neck or chest. ?You will put on a hospital gown. ?Sticky elect

## 2021-07-12 ENCOUNTER — Encounter: Payer: Self-pay | Admitting: Cardiology

## 2021-07-12 ENCOUNTER — Ambulatory Visit: Payer: 59 | Admitting: Cardiology

## 2021-07-12 ENCOUNTER — Ambulatory Visit (INDEPENDENT_AMBULATORY_CARE_PROVIDER_SITE_OTHER): Payer: 59

## 2021-07-12 VITALS — BP 118/68 | HR 81 | Ht 62.0 in | Wt 235.2 lb

## 2021-07-12 DIAGNOSIS — G4733 Obstructive sleep apnea (adult) (pediatric): Secondary | ICD-10-CM

## 2021-07-12 DIAGNOSIS — I10 Essential (primary) hypertension: Secondary | ICD-10-CM

## 2021-07-12 DIAGNOSIS — E785 Hyperlipidemia, unspecified: Secondary | ICD-10-CM

## 2021-07-12 DIAGNOSIS — R002 Palpitations: Secondary | ICD-10-CM

## 2021-07-12 DIAGNOSIS — R9431 Abnormal electrocardiogram [ECG] [EKG]: Secondary | ICD-10-CM

## 2021-07-12 NOTE — Progress Notes (Unsigned)
Enrolled for [employer] to mail a ZIO XT long term holter monitor to the patients address on file.  

## 2021-07-12 NOTE — Patient Instructions (Signed)
Medication Instructions:  ?No changes ?*If you need a refill on your cardiac medications before your next appointment, please call your pharmacy* ? ? ?Lab Work: ?None ordered ?If you have labs (blood work) drawn today and your tests are completely normal, you will receive your results only by: ?MyChart Message (if you have MyChart) OR ?A paper copy in the mail ?If you have any lab test that is abnormal or we need to change your treatment, we will call you to review the results. ? ? ?Testing/Procedures: ?Your physician has requested that you have an echocardiogram. Echocardiography is a painless test that uses sound waves to create images of your heart. It provides your doctor with information about the size and shape of your heart and how well your heart?s chambers and valves are working. You may receive an ultrasound enhancing agent through an IV if needed to better visualize your heart during the echo.This procedure takes approximately one hour. There are no restrictions for this procedure. This will take place at the [address].  ? ?Your physician has recommended that you have a home sleep study. This test records several body functions during sleep, including: brain activity, eye movement, oxygen and carbon dioxide blood levels, heart rate and rhythm, breathing rate and rhythm, the flow of air through your mouth and nose, snoring, body muscle movements, and chest and belly movement. ? ?Dr. Bjorn Pippin has ordered a CT coronary calcium score.  ? ?Test locations:  ?[facility] ([address] [place], [place] [address]) ?[facility]  ([address] [place], [place] [address])  ? ?This is $99 out of pocket. ? ? ?Coronary CalciumScan ?A coronary calcium scan is an imaging test used to look for deposits of calcium and other fatty materials (plaques) in the inner lining of the blood vessels of the heart (coronary arteries). These deposits of calcium and plaques can partly clog and  narrow the coronary arteries without producing any symptoms or warning signs. This puts a person at risk for a heart attack. This test can detect these deposits before symptoms develop. ?Tell a health care provider about: ?Any allergies you have. ?All medicines you are taking, including vitamins, herbs, eye drops, creams, and over-the-counter medicines. ?Any problems you or family members have had with anesthetic medicines. ?Any blood disorders you have. ?Any surgeries you have had. ?Any medical conditions you have. ?Whether you are pregnant or may be pregnant. ?What are the risks? ?Generally, this is a safe procedure. However, problems may occur, including: ?Harm to a pregnant woman and her unborn baby. This test involves the use of radiation. Radiation exposure can be dangerous to a pregnant woman and her unborn baby. If you are pregnant, you generally should not have this procedure done. ?Slight increase in the risk of cancer. This is because of the radiation involved in the test. ?What happens before the procedure? ?No preparation is needed for this procedure. ?What happens during the procedure? ?You will undress and remove any jewelry around your neck or chest. ?You will put on a hospital gown. ?Sticky electrodes will be placed on your chest. The electrodes will be connected to an electrocardiogram (ECG) machine to record a tracing of the electrical activity of your heart. ?A CT scanner will take pictures of your heart. During this time, you will be asked to lie still and hold your breath for 2-3 seconds while a picture of your heart is being taken. ?The procedure may vary among health care providers and hospitals. ?What  happens after the procedure? ?You can get dressed. ?You can return to your normal activities. ?It is up to you to get the results of your test. Ask your health care provider, or the department that is doing the test, when your results will be ready. ?Summary ?A coronary calcium scan is an  imaging test used to look for deposits of calcium and other fatty materials (plaques) in the inner lining of the blood vessels of the heart (coronary arteries). ?Generally, this is a safe procedure. Tell your health care provider if you are pregnant or may be pregnant. ?No preparation is needed for this procedure. ?A CT scanner will take pictures of your heart. ?You can return to your normal activities after the scan is done. ?This information is not intended to replace advice given to you by your health care provider. Make sure you discuss any questions you have with your health care provider. ?Document Released: 09/10/2007 Document Revised: 02/01/2016 Document Reviewed: 02/01/2016 ?Elsevier Interactive Patient Education ? 2017 Elsevier Inc. ? ?Christena DeemZIO XT- Long Term Monitor Instructions ? ?Your physician has requested you wear a ZIO patch monitor for 14 days.  ?This is a single patch monitor. [employer] supplies one patch monitor per enrollment. Additional ?stickers are not available. Please do not apply patch if you will be having a Nuclear Stress Test,  ?Echocardiogram, Cardiac CT, MRI, or Chest Xray during the period you would be wearing the  ?monitor. The patch cannot be worn during these tests. You cannot remove and re-apply the  ?ZIO XT patch monitor.  ?Your ZIO patch monitor will be mailed 3 day USPS to your address on file. It may take 3-5 days  ?to receive your monitor after you have been enrolled.  ?Once you have received your monitor, please review the enclosed instructions. Your monitor  ?has already been registered assigning a specific monitor serial # to you. ? ?Billing and Patient Assistance Program Information ? ?We have supplied [employer] with any of your insurance information on file for billing purposes. ?[employer] offers a sliding scale Patient Assistance Program for patients that do not have  ?insurance, or whose insurance does not completely cover the cost of the ZIO monitor.  ?You must apply for the  Patient Assistance Program to qualify for this discounted rate.  ?To apply, please call [employer] at [phone], select option 4, select option 2, ask to apply for  ?Patient Assistance Program. Meredeth [employer] will ask your household income, and how many people  ?are in your household. They will quote your out-of-pocket cost based on that information.  ?[employer] will also be able to set up a 7122-month, interest-free payment plan if needed. ? ?Applying the monitor ?  ?Shave hair from upper left chest.  ?Hold abrader disc by orange tab. Rub abrader in 40 strokes over the upper left chest as  ?indicated in your monitor instructions.  ?Clean area with 4 enclosed alcohol pads. Let dry.  ?Apply patch as indicated in monitor instructions. Patch will be placed under collarbone on left  ?side of chest with arrow pointing upward.  ?Rub patch adhesive wings for 2 minutes. Remove white label marked "1". Remove the white  ?label marked "2". Rub patch adhesive wings for 2 additional minutes.  ?While looking in a mirror, press and release button in center of patch. A small green light will  ?flash 3-4 times. This will be your only indicator that the monitor has been turned on.  ?Do not shower for the first 24 hours. You may shower  after the first 24 hours.  ?Press the button if you feel a symptom. You will hear a small click. Record Date, Time and  ?Symptom in the Patient Logbook.  ?When you are ready to remove the patch, follow instructions on the last 2 pages of Patient  ?Logbook. Stick patch monitor onto the last page of Patient Logbook.  ?Place Patient Logbook in the blue and white box. Use locking tab on box and tape box closed  ?securely. The blue and white box has prepaid postage on it. Please place it in the mailbox as  ?soon as possible. Your physician should have your test results approximately 7 days after the  ?monitor has been mailed back to [facility].  ?Call [facility] at [phone] if you have  questions regarding  ?your ZIO XT patch monitor. Call them immediately if you see an orange light blinking on your  ?monitor.  ?If your monitor falls off in less than 4 days, contact our Monitor department at [phone]

## 2021-07-15 DIAGNOSIS — R002 Palpitations: Secondary | ICD-10-CM

## 2021-07-22 ENCOUNTER — Ambulatory Visit (HOSPITAL_COMMUNITY): Payer: 59 | Attending: Cardiology

## 2021-07-22 DIAGNOSIS — R9431 Abnormal electrocardiogram [ECG] [EKG]: Secondary | ICD-10-CM | POA: Diagnosis present

## 2021-07-22 LAB — ECHOCARDIOGRAM COMPLETE
Area-P 1/2: 3.68 cm2
S' Lateral: 2.3 cm

## 2021-08-01 ENCOUNTER — Ambulatory Visit (INDEPENDENT_AMBULATORY_CARE_PROVIDER_SITE_OTHER): Payer: 59

## 2021-08-01 ENCOUNTER — Encounter (HOSPITAL_COMMUNITY): Payer: Self-pay

## 2021-08-01 ENCOUNTER — Ambulatory Visit (HOSPITAL_COMMUNITY)
Admission: EM | Admit: 2021-08-01 | Discharge: 2021-08-01 | Disposition: A | Discharge status: Home / Self Care | Payer: 59 | Attending: Nurse Practitioner | Admitting: Nurse Practitioner

## 2021-08-01 DIAGNOSIS — M79672 Pain in left foot: Secondary | ICD-10-CM | POA: Diagnosis not present

## 2021-08-01 DIAGNOSIS — R2242 Localized swelling, mass and lump, left lower limb: Secondary | ICD-10-CM

## 2021-08-01 MED ORDER — CEPHALEXIN 500 MG PO CAPS: 500.0000 mg | ORAL_CAPSULE | Freq: Four times a day (QID) | ORAL | 0 refills | Status: AC

## 2021-08-01 NOTE — Discharge Instructions (Addendum)
-   The x-ray today does not show any acute findings to explain the pain in your foot ?-Please start taking the Keflex 4 times daily for 5 days for suspected cellulitis of your left foot ?-You can continue elevation and ice as well as Tylenol to help with pain ?-Please follow-up with foot doctor as planned ?-If you develop signs of infection including fever, nausea/vomiting, chills, body aches, change in behavior or mental status, please go to emergency room ?

## 2021-08-01 NOTE — ED Provider Notes (Signed)
?[facility] ? ? ? ?CSN: [id number] ?Arrival date & time: 08/01/21  1502 ? ? ?  ? ?History   ?Chief Complaint ?Chief Complaint  ?Patient presents with  ? Foot Pain  ? ? ?HPI ?MCKENNZIE GORT is a 66 y.o. female.  ? ?Patient presents with left foot pain that has been ongoing for the past few days.  She reports the swelling and pain is over the top of her foot; not in the typical area where she has gout.  She reports the pain is sharp and rates it out of 10 out of 10 when she is walking.  She is hardly able to put any weight on her left foot.  She denies any numbness or tingling in her toes, decreased range of motion of the foot, warmth or redness, fevers, and nausea/vomiting.  She has tried ice for the pain that has helped minimally. ? ? ?Past Medical History:  ?Diagnosis Date  ? Hypertension   ? ? ?There are no problems to display for this patient. ? ? ?History reviewed. No pertinent surgical history. ? ?OB History   ?No obstetric history on file. ?  ? ? ? ?Home Medications   ? ?Prior to Admission medications   ?Medication Sig Start Date End Date Taking? Authorizing Provider  ?cephALEXin (KEFLEX) 500 MG capsule Take 1 capsule (500 mg total) by mouth 4 (four) times daily for 5 days. 08/01/21 08/06/21 Yes Eulogio Bear, NP  ?colchicine 0.6 MG tablet Take two tablets as one dose followed one hour later by one tablet. 10/29/20   Vanessa Kick, MD  ?indomethacin (INDOCIN) 50 MG capsule Take 1 capsule (50 mg total) by mouth 3 (three) times daily with meals. 10/29/20   Vanessa Kick, MD  ?Olmesartan-amLODIPine-HCTZ 40-10-12.5 MG TABS Take by mouth.    [provider]  ? ? ?Family History ?History reviewed. No pertinent family history. ? ?Social History ?Social History  ? ?Tobacco Use  ? Smoking status: Never  ? Smokeless tobacco: Never  ? ? ? ?Allergies   ?Patient has no known allergies. ? ? ?Review of Systems ?Review of Systems ? ? ?Physical Exam ?Triage Vital Signs ?ED Triage Vitals  ?Enc Vitals Group  ?    BP 08/01/21 1659 136/84  ?   Pulse Rate 08/01/21 1659 (!) 106  ?   Resp 08/01/21 1659 18  ?   Temp 08/01/21 1659 98.4 ?F (36.9 ?C)  ?   Temp Source 08/01/21 1659 Oral  ?   SpO2 08/01/21 1659 94 %  ?   Weight --   ?   Height --   ?   Head Circumference --   ?   Peak Flow --   ?   Pain Score 08/01/21 1700 10  ?   Pain Loc --   ?   Pain Edu? --   ?   Excl. in [place]? --   ? ?No data found. ? ?Updated Vital Signs ?BP 136/84 (BP Location: Left Arm)   Pulse (!) 106   Temp 98.4 ?F (36.9 ?C) (Oral)   Resp 18   LMP 03/25/2013   SpO2 94%  ? ?Visual Acuity ?Right Eye Distance:   ?Left Eye Distance:   ?Bilateral Distance:   ? ?Right Eye Near:   ?Left Eye Near:    ?Bilateral Near:    ? ?Physical Exam ?Vitals and nursing note reviewed.  ?Constitutional:   ?   General: She is not in acute distress. ?   Appearance: Normal  appearance. She is obese. She is not toxic-appearing.  ?Pulmonary:  ?   Effort: Pulmonary effort is normal. No respiratory distress.  ?Musculoskeletal:     ?   General: Swelling present.  ?   Right ankle: No swelling, deformity or ecchymosis. No tenderness. Normal range of motion. Normal pulse.  ?   Left ankle: No swelling, deformity or ecchymosis. Normal range of motion. Normal pulse.  ?   Right foot: Normal. Normal capillary refill. Normal pulse.  ?   Left foot: Normal range of motion and normal capillary refill. Swelling present. No deformity, tenderness or bony tenderness. Normal pulse.  ?Feet:  ?   Comments: Inspection: swelling to the left distal foot and phalanges, no obvious deformity or redness ?Palpation: bilateral feet are non tender to palpation; no obvious deformities palpated ?ROM: Full ROM to ankle and flexibility of foot  ?Strength: 5/5 left ankle ?Neurovascular: neurovascularly intact in left and right lower extremity ?Skin: ?   General: Skin is warm and dry.  ?   Capillary Refill: Capillary refill takes less than 2 seconds.  ?   Coloration: Skin is not jaundiced or pale.  ?   Findings: No  erythema.  ?Neurological:  ?   Mental Status: She is alert and oriented to person, place, and time.  ?   Motor: No weakness.  ?   Gait: Gait abnormal.  ?Psychiatric:     ?   Behavior: Behavior is cooperative.  ? ? ? ?UC Treatments / Results  ?Labs ?(all labs ordered are listed, but only abnormal results are displayed) ?Labs Reviewed - No data to display ? ?EKG ? ? ?Radiology ?DG Foot Complete Left ? ?Result Date: 08/01/2021 ?CLINICAL DATA:  Left foot pain x2 days. EXAM: LEFT FOOT - COMPLETE 3+ VIEW COMPARISON:  None Available. FINDINGS: There is no evidence of an acute fracture or dislocation. A chronic versus congenital deformity is seen involving the middle and distal phalanges of the fifth left toe. Mild to moderate severity degenerative changes are seen involving the metatarsophalangeal joint of the left great toe. An associated hallux valgus deformity is present. There is moderate severity adjacent soft tissue swelling. This extends along the dorsal aspect of the mid to distal left foot and medial aspects of the left great toe. IMPRESSION: 1. Degenerative changes involving the metatarsophalangeal joint of the left great toe with adjacent soft tissue swelling. Electronically Signed   By: Virgina Norfolk M.D.   On: 08/01/2021 17:56   ? ?Procedures ?Procedures (including critical care time) ? ?Medications Ordered in UC ?Medications - No data to display ? ?Initial Impression / Assessment and Plan / UC Course  ?I have reviewed the triage vital signs and the nursing notes. ? ?Pertinent labs & imaging results that were available during my care of the patient were reviewed by me and considered in my medical decision making (see chart for details). ? ?  ?Foot x-ray today shows no acute findings, there is some degenerative changes around the great MTP joint where she frequently has gout and soft tissue swelling.  Treat for possible cellulitis with cephalexin 500 mg four times daily for 5 days and encouraged close follow  up with Foot Doctor.  Encouraged elevation and ice as well. ?Final Clinical Impressions(s) / UC Diagnoses  ? ?Final diagnoses:  ?Left foot pain  ?Localized swelling of left foot  ? ? ? ?Discharge Instructions   ? ?  ?- The x-ray today does not show any acute findings to explain the pain  in your foot ?-Please start taking the Keflex 4 times daily for 5 days for suspected cellulitis of your left foot ?-You can continue elevation and ice as well as Tylenol to help with pain ?-Please follow-up with foot doctor as planned ?-If you develop signs of infection including fever, nausea/vomiting, chills, body aches, change in behavior or mental status, please go to emergency room ? ? ? ? ?ED Prescriptions   ? ? Medication Sig Dispense Auth. Provider  ? cephALEXin (KEFLEX) 500 MG capsule Take 1 capsule (500 mg total) by mouth 4 (four) times daily for 5 days. 20 capsule Eulogio Bear, NP  ? ?  ? ?PDMP not reviewed this encounter. ?  ?Eulogio Bear, NP ?08/01/21 1809 ? ?

## 2021-08-01 NOTE — ED Triage Notes (Signed)
C/o left foot pain x 2 days. She thinks she has gout. She reports having appt with her foot specialist this week.  ?

## 2021-08-04 ENCOUNTER — Ambulatory Visit: Payer: 59 | Admitting: Podiatry

## 2021-08-04 ENCOUNTER — Encounter: Payer: Self-pay | Admitting: Podiatry

## 2021-08-04 DIAGNOSIS — L600 Ingrowing nail: Secondary | ICD-10-CM | POA: Diagnosis not present

## 2021-08-04 NOTE — Progress Notes (Signed)
?  Subjective:  ?Patient ID: Caitlin Lawson, female    DOB: 1955-06-30,  MRN: [id number] ? ?Chief Complaint  ?Patient presents with  ? Nail Problem  ? ? ?66 y.o. female presents with the above complaint.  Patient presents with complaint of right medial border ingrown.  Patient states painful to touch is progressive gotten worse.  Pain scale 7 out of 10 hurts with ambulation she would like to have removed she has not seen anyone as prior to seeing me.  She is not diabetic. ? ? ?Review of Systems: Negative except as noted in the HPI. Denies N/V/F/Ch. ? ?Past Medical History:  ?Diagnosis Date  ? Hypertension   ? ? ?Current Outpatient Medications:  ?  cephALEXin (KEFLEX) 500 MG capsule, Take 1 capsule (500 mg total) by mouth 4 (four) times daily for 5 days., Disp: 20 capsule, Rfl: 0 ?  colchicine 0.6 MG tablet, Take two tablets as one dose followed one hour later by one tablet., Disp: 3 tablet, Rfl: 0 ?  indomethacin (INDOCIN) 50 MG capsule, Take 1 capsule (50 mg total) by mouth 3 (three) times daily with meals., Disp: 15 capsule, Rfl: 0 ?  Olmesartan-amLODIPine-HCTZ 40-10-12.5 MG TABS, Take by mouth., Disp: , Rfl:  ? ?Social History  ? ?Tobacco Use  ?Smoking Status Never  ?Smokeless Tobacco Never  ? ? ?No Known Allergies ?Objective:  ?There were no vitals filed for this visit. ?There is no height or weight on file to calculate BMI. ?Constitutional Well developed. ?Well nourished.  ?Vascular Dorsalis pedis pulses palpable bilaterally. ?Posterior tibial pulses palpable bilaterally. ?Capillary refill normal to all digits.  ?No cyanosis or clubbing noted. ?Pedal hair growth normal.  ?Neurologic Normal speech. ?Oriented to person, place, and time. ?Epicritic sensation to light touch grossly present bilaterally.  ?Dermatologic Painful ingrowing nail at medial nail borders of the hallux nail right. ?No other open wounds. ?No skin lesions.  ?Orthopedic: Normal joint ROM without pain or crepitus bilaterally. ?No visible  deformities. ?No bony tenderness.  ? ?Radiographs: None ?Assessment:  ? ?1. Ingrown toenail of right foot   ? ?Plan:  ?Patient was evaluated and treated and all questions answered. ? ?Ingrown Nail, right ?-Patient elects to proceed with minor surgery to remove ingrown toenail removal today. Consent reviewed and signed by patient. ?-Ingrown nail excised. See procedure note. ?-Educated on post-procedure care including soaking. Written instructions provided and reviewed. ?-Patient to follow up in 2 weeks for nail check. ? ?Procedure: Excision of Ingrown Toenail ?Location: Right 1st toe medial nail borders. ?Anesthesia: Lidocaine 1% plain; 1.5 mL and Marcaine 0.5% plain; 1.5 mL, digital block. ?Skin Prep: Betadine. ?Dressing: Silvadene; telfa; dry, sterile, compression dressing. ?Technique: Following skin prep, the toe was exsanguinated and a tourniquet was secured at the base of the toe. The affected nail border was freed, split with a nail splitter, and excised. Chemical matrixectomy was then performed with phenol and irrigated out with alcohol. The tourniquet was then removed and sterile dressing applied. ?Disposition: Patient tolerated procedure well. Patient to return in 2 weeks for follow-up.  ? ?No follow-ups on file. ?

## 2021-08-20 ENCOUNTER — Ambulatory Visit
Admission: RE | Admit: 2021-08-20 | Discharge: 2021-08-20 | Disposition: A | Discharge status: Home / Self Care | Payer: Self-pay | Source: Ambulatory Visit | Attending: Cardiology | Admitting: Cardiology

## 2021-08-20 DIAGNOSIS — E785 Hyperlipidemia, unspecified: Secondary | ICD-10-CM

## 2021-08-31 ENCOUNTER — Ambulatory Visit: Payer: 59 | Admitting: Podiatry

## 2021-08-31 ENCOUNTER — Encounter: Payer: Self-pay | Admitting: Podiatry

## 2021-08-31 DIAGNOSIS — M7751 Other enthesopathy of right foot: Secondary | ICD-10-CM | POA: Diagnosis not present

## 2021-08-31 DIAGNOSIS — M10471 Other secondary gout, right ankle and foot: Secondary | ICD-10-CM | POA: Diagnosis not present

## 2021-08-31 MED ORDER — DEXAMETHASONE SODIUM PHOSPHATE 120 MG/30ML IJ SOLN: 4.0000 mg | Freq: Once | INTRAMUSCULAR | Status: AC

## 2021-08-31 NOTE — Progress Notes (Signed)
  Subjective:  Patient ID: Caitlin Lawson, female    DOB: 30-Oct-1955,   MRN: [id number]  Chief Complaint  Patient presents with   Gout    Right great joint pain. Pt believes her previous ingrown removal aggravated her gout.     66 y.o. female presents for concern of right great toe joint pain and history of gout. Recently had ingrown nail procedure done and that is doing well. Relates a few days ago her gout started to flare. PCP gave colchicine and has been taking that but pain has not improved yet. Relates redness pain and swelling in great toe joint. Does have history of gout . Denies any other pedal complaints. Denies n/v/f/c.   Past Medical History:  Diagnosis Date   Hypertension     Objective:  Physical Exam: Vascular: DP/PT pulses 2/4 bilateral. CFT <3 seconds. Normal hair growth on digits. No edema.  Skin. No lacerations or abrasions bilateral feet.  Musculoskeletal: MMT 5/5 bilateral lower extremities in DF, PF, Inversion and Eversion. Deceased ROM in DF of ankle joint. Tenderness to first MPJ and pain with ROM. Erythema and edema noted around joint.  Neurological: Sensation intact to light touch.   Assessment:   1. Acute gout due to other secondary cause involving toe of right foot   2. Capsulitis of toe, right      Plan:  Patient was evaluated and treated and all questions answered. -Xrays reviewed -Discussed treatement options for gouty arthritis and gout education provided. -Patient opted for injection. After oral consent, injected right first MPJ with 1cc lmarcaine plain mixed with 0.5cc  Dexmethasone phosphate without complication; post injection care explained. -Discussed diet and modifications.  -Continue Colchicine  -Advised patient to call if symptoms are not improved within 1 week -Patient to return in 3 weeks for re-check/further discussion for long term management of gout or sooner if condition worsens.   Louann Sjogren, DPM

## 2021-09-02 ENCOUNTER — Ambulatory Visit (HOSPITAL_BASED_OUTPATIENT_CLINIC_OR_DEPARTMENT_OTHER): Payer: 59 | Attending: Cardiology | Admitting: Cardiovascular Disease

## 2021-09-02 DIAGNOSIS — G4733 Obstructive sleep apnea (adult) (pediatric): Secondary | ICD-10-CM

## 2021-09-03 ENCOUNTER — Encounter (HOSPITAL_BASED_OUTPATIENT_CLINIC_OR_DEPARTMENT_OTHER): Payer: 59 | Admitting: Cardiovascular Disease

## 2021-09-13 ENCOUNTER — Encounter (HOSPITAL_BASED_OUTPATIENT_CLINIC_OR_DEPARTMENT_OTHER): Payer: Self-pay | Admitting: Cardiovascular Disease

## 2021-09-13 NOTE — Procedures (Signed)
      Patient Name: Caitlin Lawson, Caitlin Lawson Date: 09/02/2021 Gender: Female D.O.B: 06-20-1955 Age (years): 9 Referring Provider: Epifanio Lesches Height (inches): 62 Interpreting Physician: Nicki Guadalajara MD, ABSM Weight (lbs): 230 RPSGT: [place] BMI: 42 MRN: [id number] Neck Size: 15.00  CLINICAL INFORMATION Sleep Study Type: HST  Indication for sleep study: snoring, witnessed apnea, OSA untreated.  Epworth Sleepiness Score: 3  SLEEP STUDY TECHNIQUE A multi-channel overnight portable sleep study was performed. The channels recorded were: nasal airflow, thoracic respiratory movement, and oxygen saturation with a pulse oximetry. Snoring was also monitored.  MEDICATIONS colchicine 0.6 MG tablet indomethacin (INDOCIN) 50 MG capsule Olmesartan-amLODIPine-HCTZ 40-10-12.5 MG TABS Patient self administered medications include: N/A.  SLEEP ARCHITECTURE Patient was studied for 375.4 minutes. The sleep efficiency was 100.0 % and the patient was supine for 0%. The arousal index was 0.0 per hour.  RESPIRATORY PARAMETERS The overall AHI was 29.4 per hour, with a central apnea index of 0 per hour. There is a positional component with supine sleep AHI 30.3/h versus non-supine sleep AHI 14.4/h.  The oxygen nadir was 76% during sleep. Time spent < 89% was 16.8 minutes.  CARDIAC DATA Mean heart rate during sleep was 63.9 bpm. Heart rate range was 54 to 110 bpm.  IMPRESSIONS - Moderately severe obstructive sleep apnea occurred during this study (AHI 29.4/h). - Severe oxygen desaturation to a nadir of 76%. - Patient snored for 110.3 minutes (29.4%) during the sleep.  DIAGNOSIS - Obstructive Sleep Apnea (G47.33) - Nocturnal Hypoxemia (G47.36)  RECOMMENDATIONS - Therapeutic CPAP for treatment of her moderately severe sleep disordered breathing with significant nocturnal oxygen desaturation. If unable to obtain an in-lab titration, initiate Auto-PAP with EPR of 3 at 7 - 20 cm  of water.  - Effort shjould be made to optimize nasal and oropharyngeal patency. - Positional therapy avoiding supine position during sleep. - Avoid alcohol, sedatives and other CNS depressants that may worsen sleep apnea and disrupt normal sleep architecture. - Sleep hygiene should be reviewed to assess factors that may improve sleep quality. - Weight management (BMI 42)) and regular exercise should be initiated or continued. - Recommend a download anbd sleep clinic evaluation after one month of therapy.   [Electronically signed] 09/13/2021 05:55 PM  Nicki Guadalajara MD, [facility], ABSM Diplomate, American Board of Sleep Medicine  NPI: [id number]  [facility] PH: [phone]   FX: [phone] ACCREDITED BY THE AMERICAN ACADEMY OF SLEEP MEDICINE

## 2021-09-21 ENCOUNTER — Ambulatory Visit: Payer: 59 | Admitting: Podiatry

## 2022-01-03 NOTE — Progress Notes (Signed)
Cardiology Office Note:    Date:  01/12/2022   ID:  Caitlin Lawson, DOB [birth year], MRN [id number]  PCP:  Renaye Rakers, MD   [facility] Providers Cardiologist:  Little Ishikawa, MD Cardiology APP:  Marcelino Duster, [place]     Referring MD: Renaye Rakers, MD   Chief Complaint  Patient presents with   Follow-up    4 months.    History of Present Illness:    Caitlin Lawson is a 66 y.o. female with a hx of gout, hypertension, hyperlipidemia, OSA not on CPAP, and palpitations.  She was referred to cardiology for abnormal EKG.  EKG showed LVH and poor R wave progression.  Echocardiogram was obtained LVEF 60-65%, grade 1 diastolic dysfunction, normal RV, no significant valvular disease. She reported palpitations and wore a heart monitor that showed no significant arrhythmias.  Dr. Bjorn Pippin ordered a coronary calcium score for risk stratification with a 10-year ASCVD risk score of 7.1%.  Coronary calcium score was 0.  Statin therapy was not initiated.  She is maintained on olmesartan-amlodipine-HCTZ 40-10-12.5 mg daily.  She presents today for follow-up. She is having no further palpitations, no cardiac complaints. She is starting a walking program on the treadmill, which should help with hyperlipidemia.    Past Medical History:  Diagnosis Date   Hypertension     History reviewed. No pertinent surgical history.  Current Medications: Current Meds  Medication Sig   colchicine 0.6 MG tablet Take two tablets as one dose followed one hour later by one tablet.   indomethacin (INDOCIN) 50 MG capsule Take 1 capsule (50 mg total) by mouth 3 (three) times daily with meals.   Olmesartan-amLODIPine-HCTZ 40-10-12.5 MG TABS Take by mouth.   Current Facility-Administered Medications for the 01/12/22 encounter (Office Visit) with Marcelino Duster, PA  Medication   dexamethasone (DECADRON) injection 4 mg     Allergies:   Patient has no known allergies.   Social History    Socioeconomic History   Marital status: Single    Spouse name: Not on file   Number of children: Not on file   Years of education: Not on file   Highest education level: Not on file  Occupational History   Not on file  Tobacco Use   Smoking status: Never   Smokeless tobacco: Never  Substance and Sexual Activity   Alcohol use: Not on file   Drug use: Not on file   Sexual activity: Not on file  Other Topics Concern   Not on file  Social History Narrative   Not on file   Social Determinants of Health   Financial Resource Strain: Not on file  Food Insecurity: Not on file  Transportation Needs: Not on file  Physical Activity: Not on file  Stress: Not on file  Social Connections: Not on file     Family History: The patient's family history is not on file.  ROS:   Please see the history of present illness.     All other systems reviewed and are negative.  EKGs/Labs/Other Studies Reviewed:    The following studies were reviewed today:  Heart monitor 08/04/21: Patient had a min HR of 56 bpm, max HR of 143 bpm, and avg HR of 85 bpm. Predominant underlying rhythm was Sinus Rhythm. Slight P wave morphology changes were noted. Isolated SVEs were rare (<1.0%), SVE Couplets were rare (<1.0%), and no SVE Triplets  were present. Isolated VEs were rare (<1.0%), and no VE Couplets or VE  Triplets were present.  5 patient triggered events, corresponding sinus rhythm  PACs   Echo 07/22/21:  1. Left ventricular ejection fraction, by estimation, is 60 to 65%. The  left ventricle has normal function. The left ventricle has no regional  wall motion abnormalities. Left ventricular diastolic parameters are  consistent with Grade I diastolic  dysfunction (impaired relaxation).   2. Right ventricular systolic function is normal. The right ventricular  size is normal. There is normal pulmonary artery systolic pressure. The  estimated right ventricular systolic pressure is 28.0 mmHg.   3.  The mitral valve is normal in structure. Trivial mitral valve  regurgitation. No evidence of mitral stenosis.   4. The aortic valve is tricuspid. Aortic valve regurgitation is not  visualized. No aortic stenosis is present.   5. The inferior vena cava is normal in size with greater than 50%  respiratory variability, suggesting right atrial pressure of 3 mmHg.    Coronary calcium score 08/20/21: IMPRESSION: Coronary calcium score of 0.  EKG:  EKG is not ordered today.   Recent Labs: No results found for requested labs within last 365 days.  Recent Lipid Panel No results found for: "CHOL", "TRIG", "HDL", "CHOLHDL", "VLDL", "LDLCALC", "LDLDIRECT"   Risk Assessment/Calculations:                Physical Exam:    VS:  BP 102/64 (BP Location: Left Arm, Patient Position: Sitting, Cuff Size: Large)   Pulse 80   Ht 5\' 2"  (1.575 m)   Wt 239 lb (108.4 kg)   LMP 03/25/2013   BMI 43.71 kg/m     Wt Readings from Last 3 Encounters:  01/12/22 239 lb (108.4 kg)  07/12/21 235 lb 3.2 oz (106.7 kg)  07/06/14 245 lb (111.1 kg)     GEN:  Well nourished, well developed in no acute distress HEENT: Normal NECK: No JVD; No carotid bruits LYMPHATICS: No lymphadenopathy CARDIAC: RRR, no murmurs, rubs, gallops RESPIRATORY:  Clear to auscultation without rales, wheezing or rhonchi  ABDOMEN: Soft, non-tender, non-distended MUSCULOSKELETAL:  No edema; No deformity  SKIN: Warm and dry NEUROLOGIC:  Alert and oriented x 3 PSYCHIATRIC:  Normal affect   ASSESSMENT:    1. Palpitations   2. Essential hypertension   3. Hyperlipidemia, unspecified hyperlipidemia type    PLAN:    In order of problems listed above:  Palpitations Heart monitor did not show significant arrhythmias. No further palpitations   Hypertension Olmesartan-amlodipine-HCTZ 40-10-12.5 mg daily She ate 2 hotdogs (which she never does) and BP remains low   Hyperlipidemia 10-year ASCVD risk score 7.1%, borderline for  needing a statin.  Coronary calcium score was 0.  A statin was not initiated.  Follow lipid panel, can be done with PCP.    I will have her return in 2 years or sooner if needed.            Medication Adjustments/Labs and Tests Ordered: Current medicines are reviewed at length with the patient today.  Concerns regarding medicines are outlined above.  No orders of the defined types were placed in this encounter.  No orders of the defined types were placed in this encounter.   Patient Instructions  Medication Instructions:  Your physician recommends that you continue on your current medications as directed. Please refer to the Current Medication list given to you today.   *If you need a refill on your cardiac medications before your next appointment, please call your pharmacy*   Lab Work: NONE ordered  at this time of appointment   If you have labs (blood work) drawn today and your tests are completely normal, you will receive your results only by: [place] (if you have MyChart) OR A paper copy in the mail If you have any lab test that is abnormal or we need to change your treatment, we will call you to review the results.   Testing/Procedures: NONE ordered at this time of appointment     Follow-Up: At [facility], you and your health needs are our priority.  As part of our continuing mission to provide you with exceptional heart care, we have created designated Provider Care Teams.  These Care Teams include your primary Cardiologist (physician) and Advanced Practice Providers (APPs -  Physician Assistants and Nurse Practitioners) who all work together to provide you with the care you need, when you need it.  We recommend signing up for the patient portal called "MyChart".  Sign up information is provided on this After Visit Summary.  MyChart is used to connect with patients for Virtual Visits (Telemedicine).  Patients are able to view lab/test results,  encounter notes, upcoming appointments, etc.  Non-urgent messages can be sent to your provider as well.   To learn more about what you can do with MyChart, go to [email or web address].    Your next appointment:   2 year(s)  The format for your next appointment:   In Person  Provider:   Donato Heinz, MD     Other Instructions  Important Information About Sugar         Signed, Ledora Bottcher, [place]  01/12/2022 4:27 PM    [place]

## 2022-01-12 ENCOUNTER — Ambulatory Visit: Payer: 59 | Attending: Physician Assistant | Admitting: Physician Assistant

## 2022-01-12 ENCOUNTER — Encounter: Payer: Self-pay | Admitting: Physician Assistant

## 2022-01-12 VITALS — BP 102/64 | HR 80 | Ht 62.0 in | Wt 239.0 lb

## 2022-01-12 DIAGNOSIS — E785 Hyperlipidemia, unspecified: Secondary | ICD-10-CM | POA: Diagnosis not present

## 2022-01-12 DIAGNOSIS — R002 Palpitations: Secondary | ICD-10-CM

## 2022-01-12 DIAGNOSIS — I10 Essential (primary) hypertension: Secondary | ICD-10-CM | POA: Diagnosis not present

## 2022-01-12 NOTE — Patient Instructions (Signed)
Medication Instructions:  Your physician recommends that you continue on your current medications as directed. Please refer to the Current Medication list given to you today.   *If you need a refill on your cardiac medications before your next appointment, please call your pharmacy*   Lab Work: NONE ordered at this time of appointment   If you have labs (blood work) drawn today and your tests are completely normal, you will receive your results only by: [place] (if you have MyChart) OR A paper copy in the mail If you have any lab test that is abnormal or we need to change your treatment, we will call you to review the results.   Testing/Procedures: NONE ordered at this time of appointment     Follow-Up: At [facility], you and your health needs are our priority.  As part of our continuing mission to provide you with exceptional heart care, we have created designated Provider Care Teams.  These Care Teams include your primary Cardiologist (physician) and Advanced Practice Providers (APPs -  Physician Assistants and Nurse Practitioners) who all work together to provide you with the care you need, when you need it.  We recommend signing up for the patient portal called "MyChart".  Sign up information is provided on this After Visit Summary.  MyChart is used to connect with patients for Virtual Visits (Telemedicine).  Patients are able to view lab/test results, encounter notes, upcoming appointments, etc.  Non-urgent messages can be sent to your provider as well.   To learn more about what you can do with MyChart, go to [email or web address].    Your next appointment:   2 year(s)  The format for your next appointment:   In Person  Provider:   Donato Heinz, MD     Other Instructions  Important Information About Sugar

## 2023-05-10 ENCOUNTER — Other Ambulatory Visit: Payer: Self-pay | Admitting: Family Medicine

## 2023-05-10 DIAGNOSIS — R5381 Other malaise: Secondary | ICD-10-CM

## 2023-05-10 DIAGNOSIS — Z1231 Encounter for screening mammogram for malignant neoplasm of breast: Secondary | ICD-10-CM

## 2023-05-11 ENCOUNTER — Ambulatory Visit
Admission: RE | Admit: 2023-05-11 | Discharge: 2023-05-11 | Disposition: A | Discharge status: Home / Self Care | Payer: 59 | Source: Ambulatory Visit | Attending: Family Medicine | Admitting: Family Medicine

## 2023-05-11 ENCOUNTER — Other Ambulatory Visit: Payer: Self-pay | Admitting: Family Medicine

## 2023-05-11 ENCOUNTER — Encounter: Payer: Self-pay | Admitting: Family Medicine

## 2023-05-11 DIAGNOSIS — Z13820 Encounter for screening for osteoporosis: Secondary | ICD-10-CM

## 2023-05-14 IMAGING — DX DG FOOT COMPLETE 3+V*L*
3 series · 3 of 3 positions shown · non-contrast
Comparison: None Available.

CLINICAL DATA: Left foot pain x2 days.

EXAM:
LEFT FOOT - COMPLETE 3+ VIEW

[foot ap]
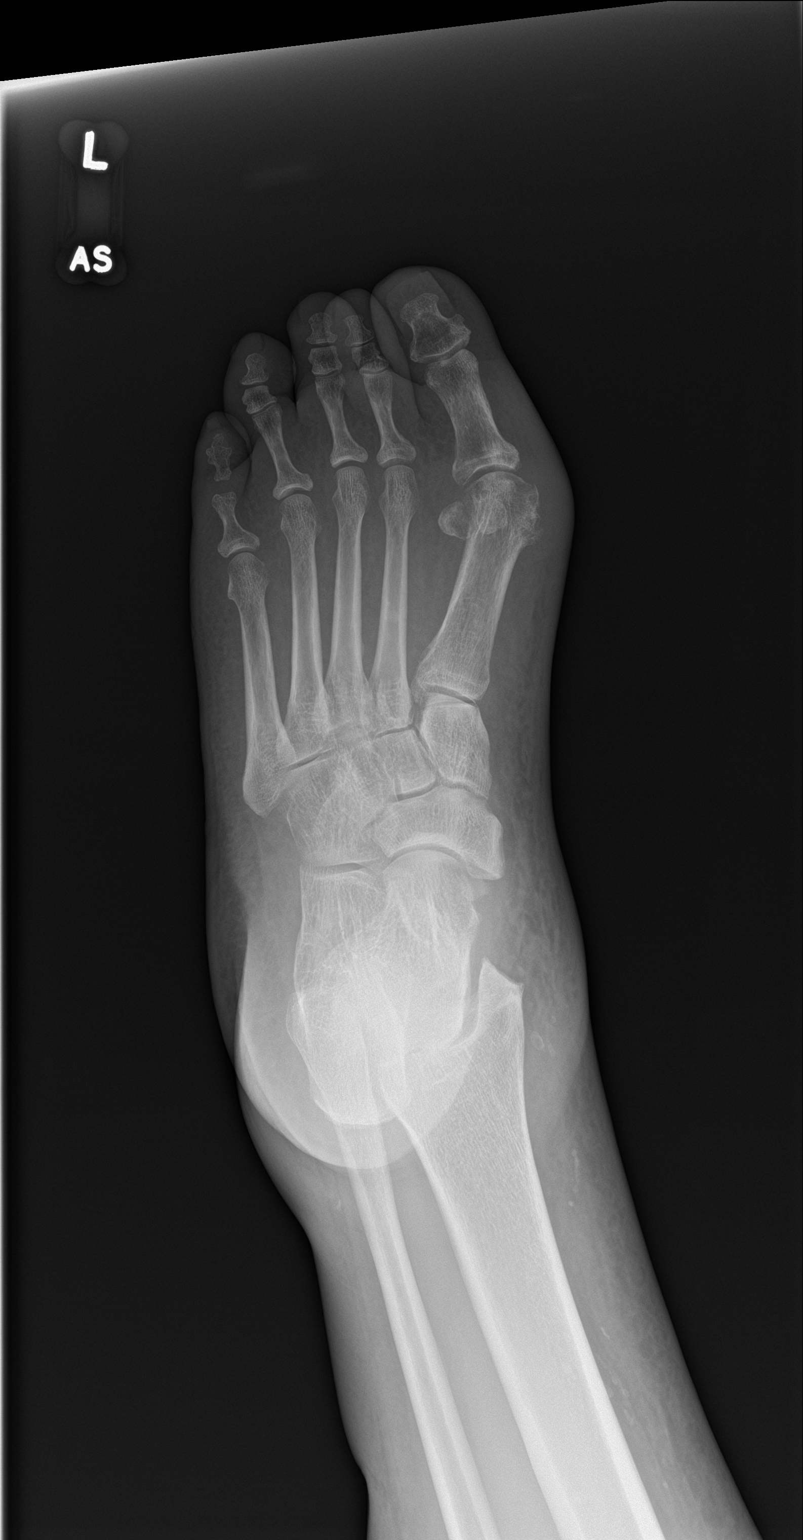

[foot obl]
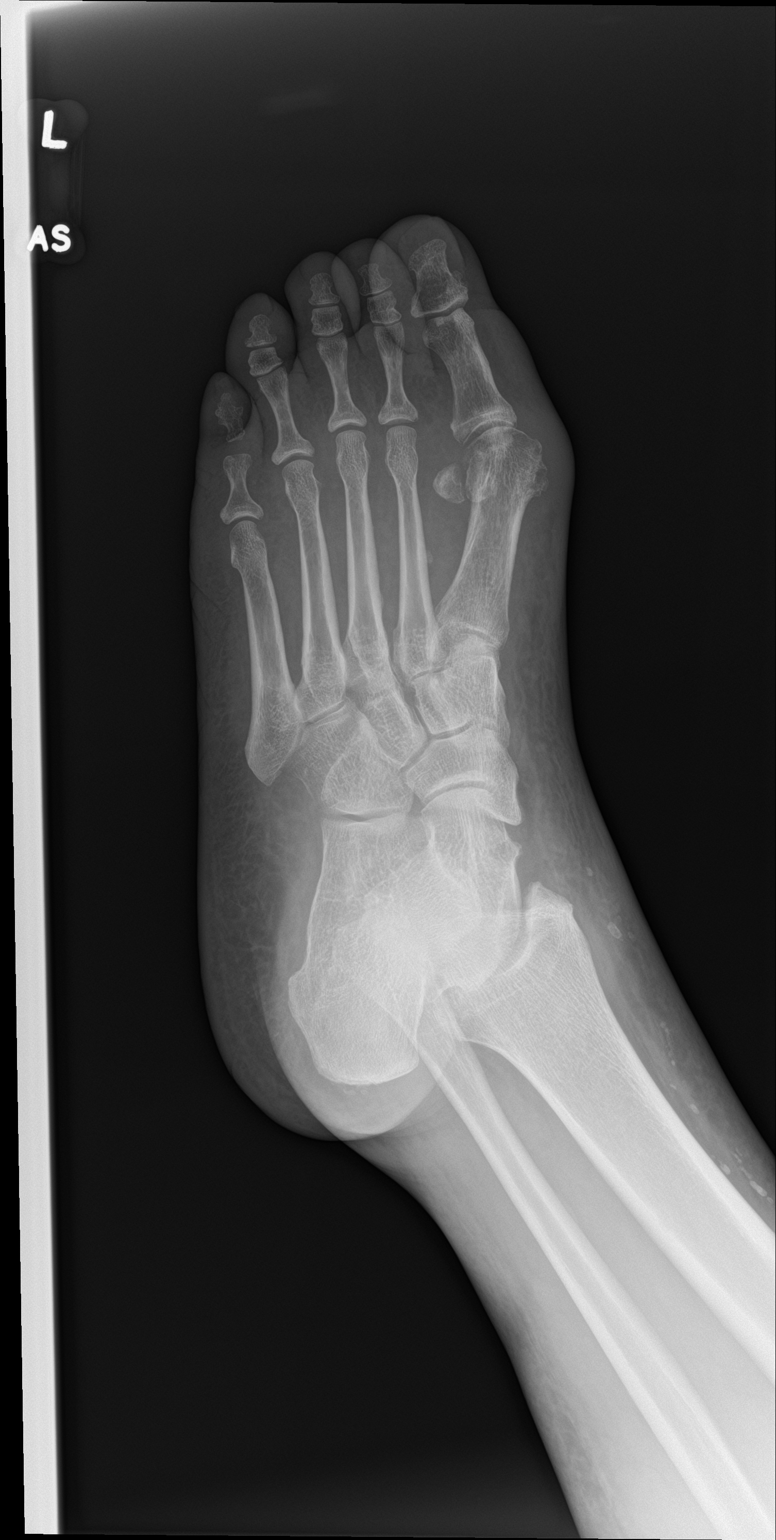

[foot lat]
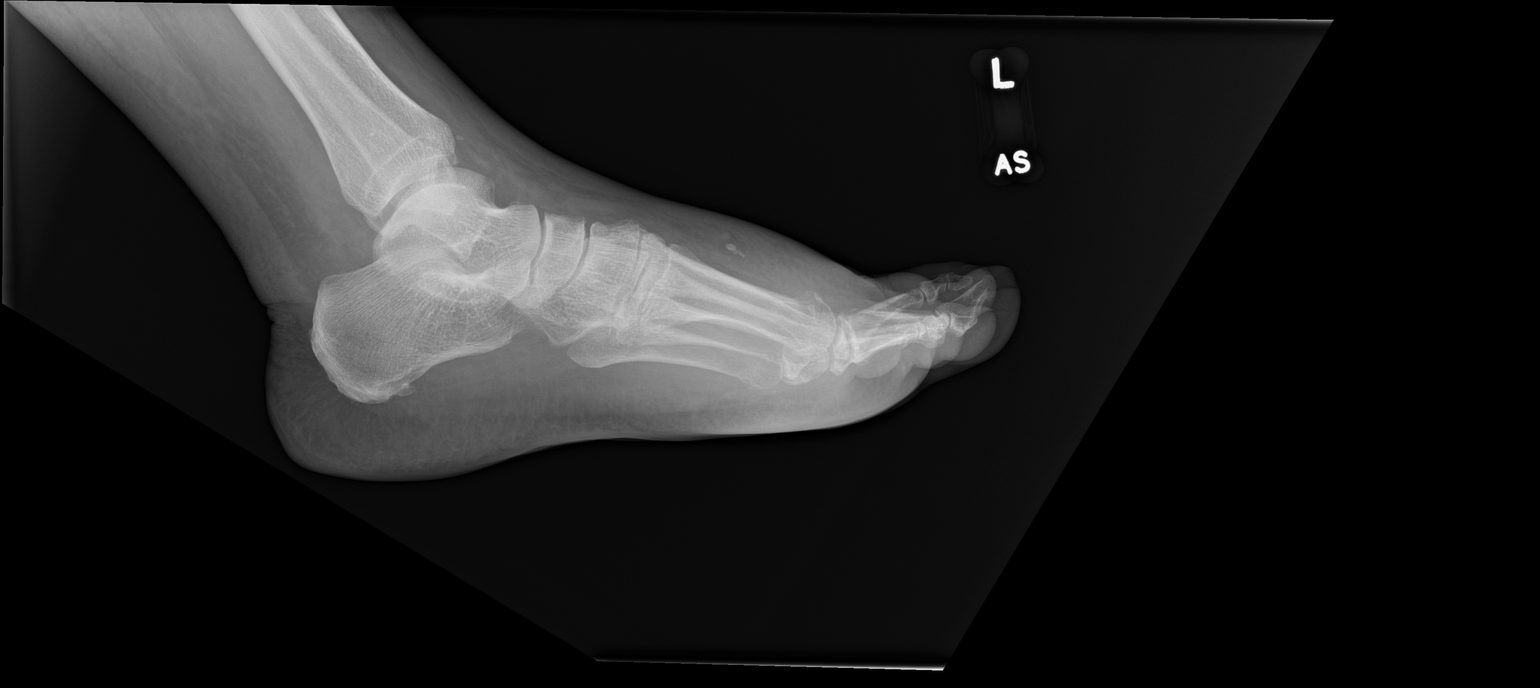

[3 of 3 positions shown; findings below may reference images not displayed]

FINDINGS: There is no evidence of an acute fracture or dislocation. A chronic
versus congenital deformity is seen involving the middle and distal
phalanges of the fifth left toe. Mild to moderate severity
degenerative changes are seen involving the metatarsophalangeal
joint of the left great toe. An associated hallux valgus deformity
is present. There is moderate severity adjacent soft tissue
swelling. This extends along the dorsal aspect of the mid to distal
left foot and medial aspects of the left great toe.
IMPRESSION: 1. Degenerative changes involving the metatarsophalangeal joint of
the left great toe with adjacent soft tissue swelling.

## 2023-05-18 ENCOUNTER — Ambulatory Visit: Payer: 59

## 2023-05-24 ENCOUNTER — Ambulatory Visit
Admission: RE | Admit: 2023-05-24 | Discharge: 2023-05-24 | Disposition: A | Discharge status: Home / Self Care | Payer: 59 | Source: Ambulatory Visit | Attending: Family Medicine | Admitting: Family Medicine

## 2023-05-24 DIAGNOSIS — Z1231 Encounter for screening mammogram for malignant neoplasm of breast: Secondary | ICD-10-CM

## 2023-05-31 ENCOUNTER — Ambulatory Visit: Payer: 59

## 2023-06-02 IMAGING — CT CT CARDIAC CORONARY ARTERY CALCIUM SCORE
3 series · 14 of 20 positions shown, 16 images · non-contrast
Comparison: None.
COMPARISON: None.

Addendum:
EXAM:
OVER-READ INTERPRETATION  CT CHEST

The following report is an over-read performed by radiologist Dr.
over-read does not include interpretation of cardiac or coronary
anatomy or pathology. The coronary calcium score interpretation by
the cardiologist is attached.
CLINICAL DATA: Cardiovascular Disease Risk stratification
Coronary Calcium Score
TECHNIQUE: A gated, non-contrast computed tomography scan of the heart was
performed using 3mm slice thickness. Axial images were analyzed on a
dedicated workstation. Calcium scoring of the coronary arteries was
performed using the Agatston method.

[Series 2: cascseq 2.0 sa36 70% (id) · axial · 0.39mm/px · z∈[-211,-141]mm · 4 of 59 slices shown]
[im 12/59  vessel]
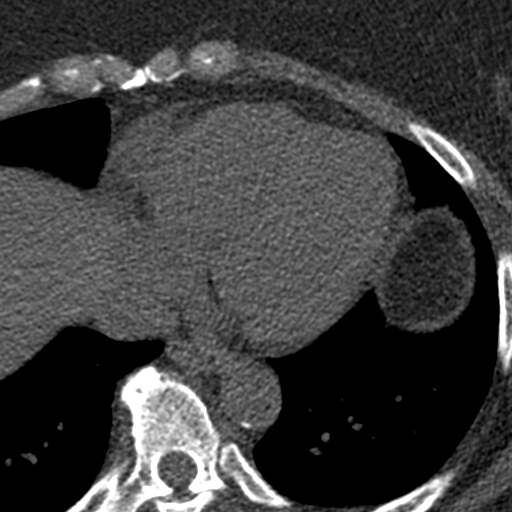
[im 24/59  vessel]
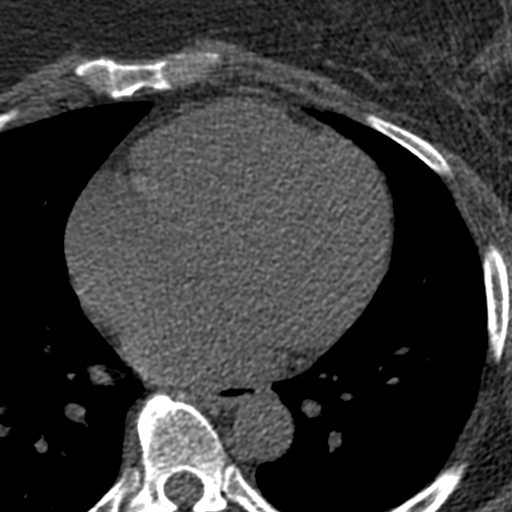
[im 35/59  vessel]
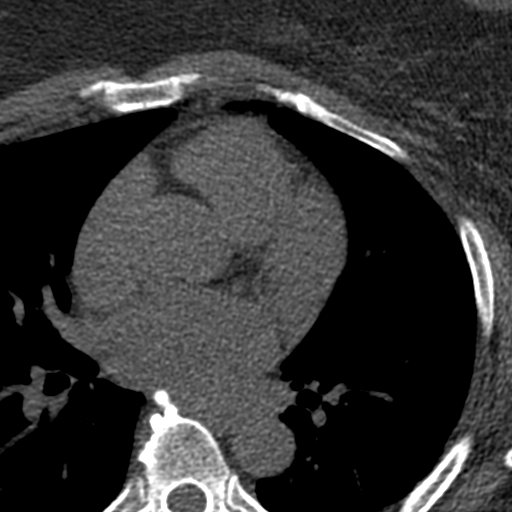
[im 47/59  vessel]
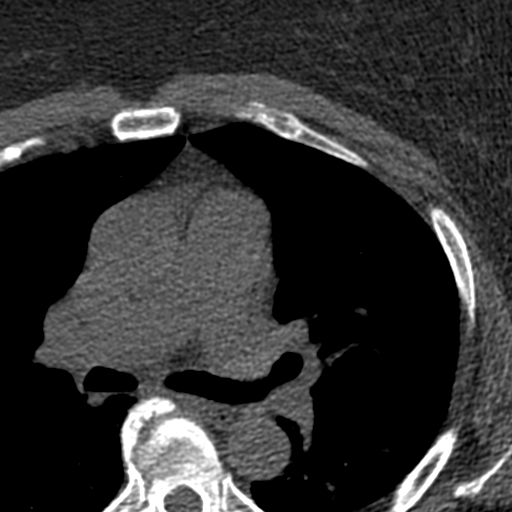

[Series 3: cascseq 2.0 bf37 st · axial · 0.66mm/px · z∈[-215,-137]mm · 5 of 59 slices shown, 7 images]
[im 10/59  vessel]
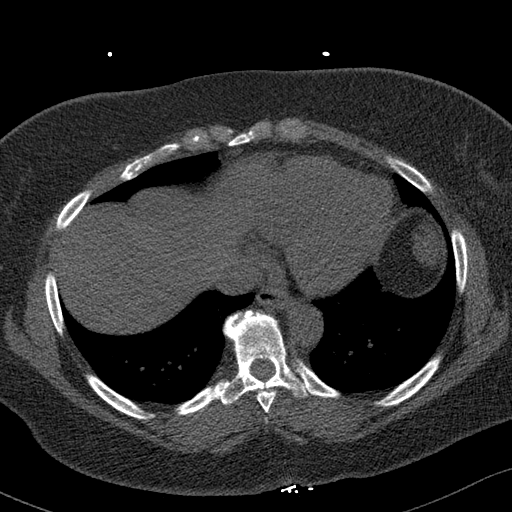
[im 10/59  lung]
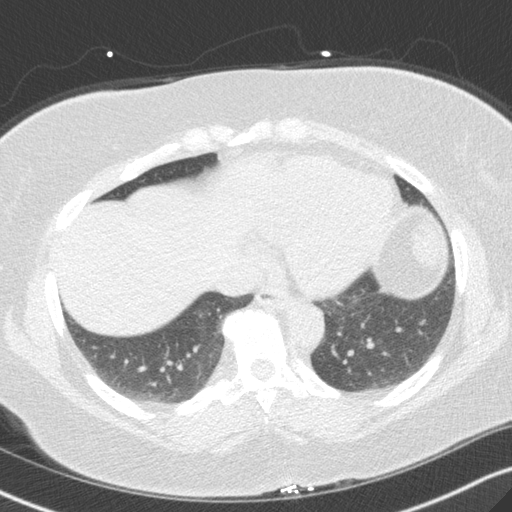
[im 20/59  vessel]
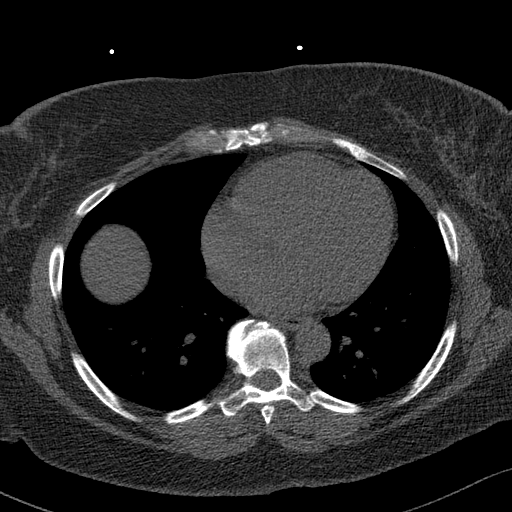
[im 30/59  vessel]
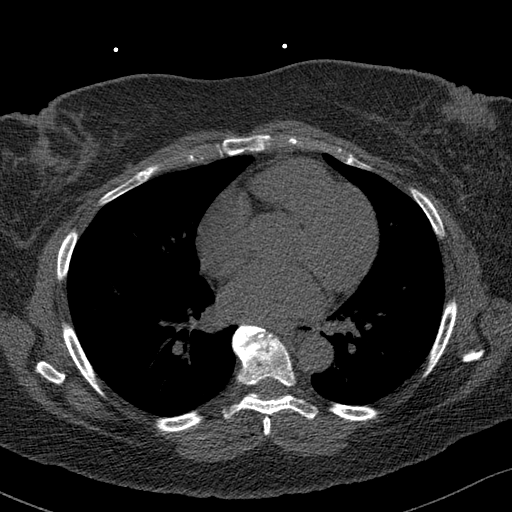
[im 39/59  vessel]
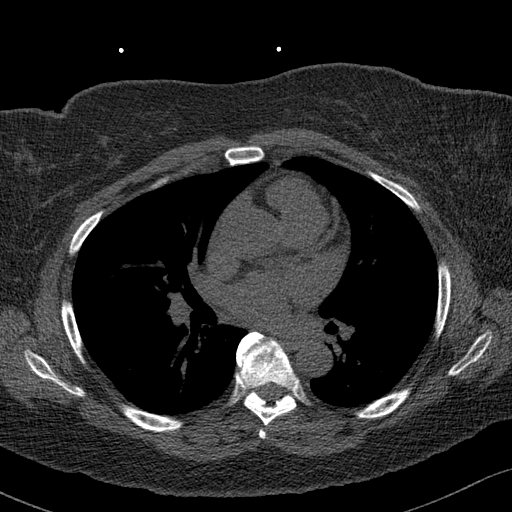
[im 49/59  vessel]
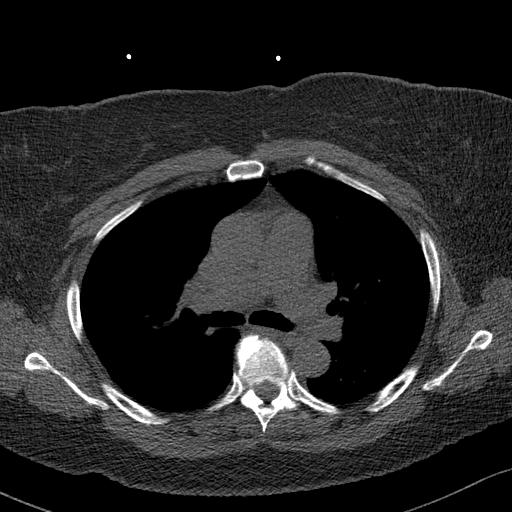
[im 49/59  lung]
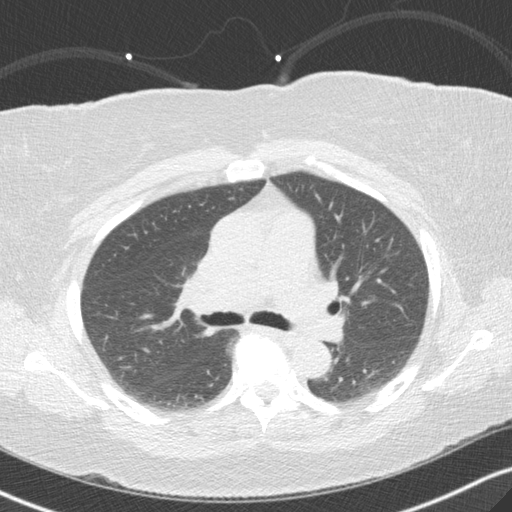

[Series 4: cascseq 2.0 br59 lung · axial · 0.66mm/px · z∈[-215,-137]mm · 5 of 59 slices shown]
[im 10/59  lung]
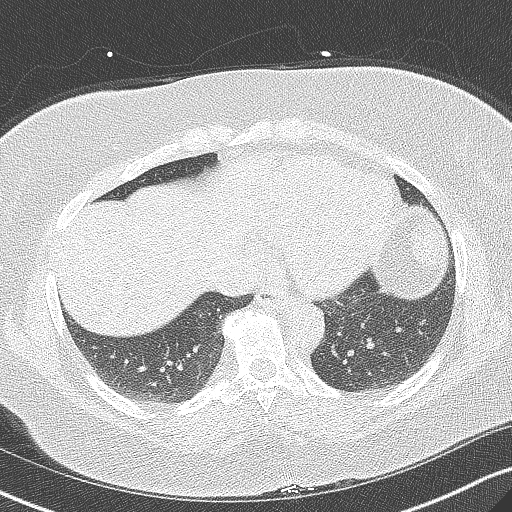
[im 20/59  lung]
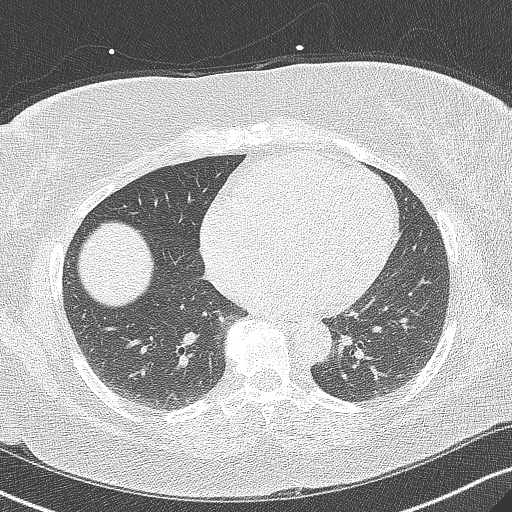
[im 30/59  lung]
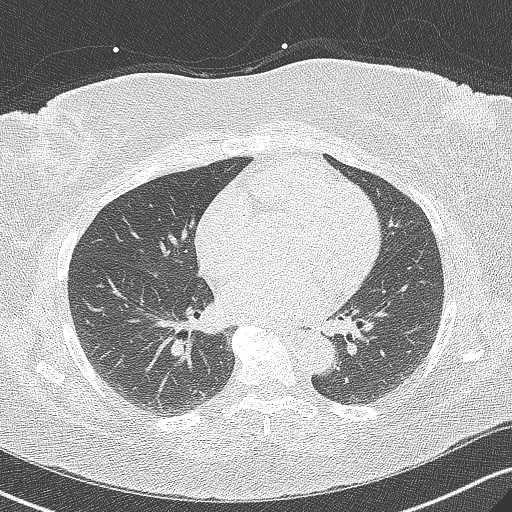
[im 39/59  lung]
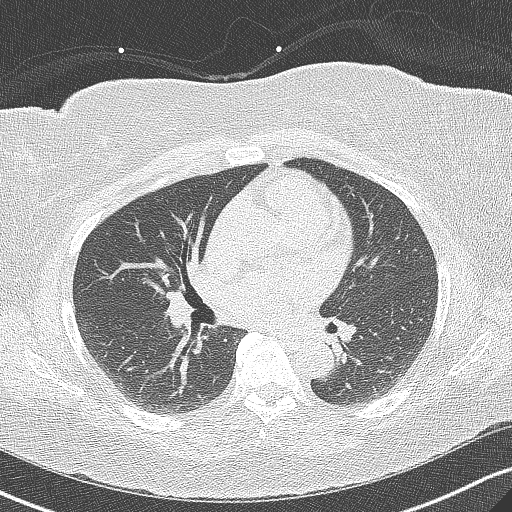
[im 49/59  lung]
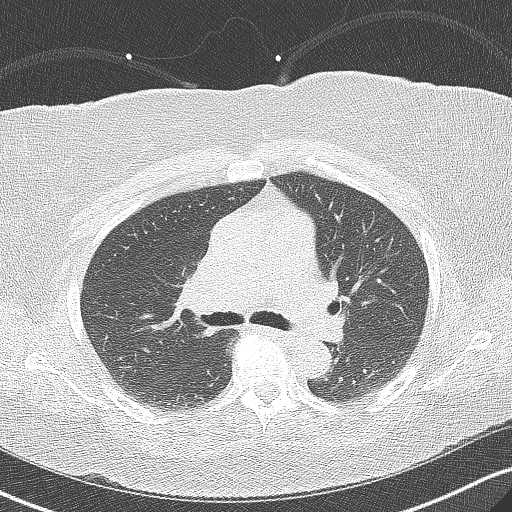

[14 of 20 positions shown; findings below may reference images not displayed]

FINDINGS: Extracardiac vascular: Aortic arch atherosclerosis.

Mediastinum: Unremarkable

Lungs: Unremarkable

Upper abdomen: Unremarkable

Musculoskeletal: Lower thoracic spondylosis.
IMPRESSION: 1.  Aortic Atherosclerosis (ET84K-0S7.7).
2. Lower thoracic spondylosis.
FINDINGS: Coronary arteries: Normal origins.

Coronary Calcium Score:

Left main:

Left anterior descending artery:

Left circumflex artery:

Right coronary artery:

Total: 0

Percentile:

Pericardium: Normal.

Ascending Aorta: Normal caliber.

Non-cardiac: See separate report from [REDACTED].
IMPRESSION: Coronary calcium score of 0.



If CAC=0, it is reasonable to withhold statin therapy and reassess
in 5 to 10 years, as long as higher risk conditions are absent
(diabetes mellitus, family history of premature CHD in first degree
relatives (males <55 years; females <65 years), cigarette smoking,
or LDL >=190 mg/dL).

If CAC is 1 to 99, it is reasonable to initiate statin therapy for
patients >=55 years of age.

If CAC is >=100 or >=75th percentile, it is reasonable to initiate
statin therapy at any age.

Cardiology referral should be considered for patients with CAC
scores >=400 or >=75th percentile.

*9393 AHA/ACC/AACVPR/AAPA/ABC/[place]/DLIMA/FLOW/[place]/PEMBA/LAYEQ/IRAKLIS
Guideline on the Management of Blood Cholesterol: A Report of the
American College of Cardiology/American Heart Association Task Force
on Clinical Practice Guidelines. J Am Coll Cardiol.
4588;73(24):3854-3164.

*** End of Addendum ***
EXAM:
OVER-READ INTERPRETATION  CT CHEST

The following report is an over-read performed by radiologist Dr.
over-read does not include interpretation of cardiac or coronary
anatomy or pathology. The coronary calcium score interpretation by
the cardiologist is attached.
FINDINGS: Extracardiac vascular: Aortic arch atherosclerosis.

Mediastinum: Unremarkable

Lungs: Unremarkable

Upper abdomen: Unremarkable

Musculoskeletal: Lower thoracic spondylosis.
IMPRESSION: 1.  Aortic Atherosclerosis (ET84K-0S7.7).
2. Lower thoracic spondylosis.

## 2024-01-08 ENCOUNTER — Ambulatory Visit: Admitting: Podiatry

## 2024-01-08 ENCOUNTER — Ambulatory Visit (INDEPENDENT_AMBULATORY_CARE_PROVIDER_SITE_OTHER)

## 2024-01-08 ENCOUNTER — Encounter: Payer: Self-pay | Admitting: Podiatry

## 2024-01-08 DIAGNOSIS — M10071 Idiopathic gout, right ankle and foot: Secondary | ICD-10-CM

## 2024-01-08 DIAGNOSIS — M21611 Bunion of right foot: Secondary | ICD-10-CM

## 2024-01-08 DIAGNOSIS — M21619 Bunion of unspecified foot: Secondary | ICD-10-CM | POA: Diagnosis not present

## 2024-01-08 MED ORDER — METHYLPREDNISOLONE 4 MG PO TBPK: ORAL_TABLET | ORAL | 0 refills | Status: AC

## 2024-01-08 NOTE — Progress Notes (Unsigned)
  Subjective:  Patient ID: Caitlin Lawson, female    DOB: 1955/12/26,  MRN: [id number]  Chief Complaint  Patient presents with   Gout    Rm14 Patient complains of pain right foot 65mtpj/ hx of gout/red, warm and painful for 1 week.    Discussed the use of AI scribe software for clinical note transcription with the patient, who gave verbal consent to proceed.  History of Present Illness Caitlin Lawson is a 68 year old female with gout who presents with right foot pain.  She experiences radiating and sometimes localized pain in her right foot, which began late last week. The pain is severe, preventing comfortable eating or drinking, and is sensitive to touch. There are no recent injuries, falls, fevers, or chills.  Prior to the foot pain, she had discomfort in her right ankle, which she managed with cold packs, elevation, and massage, resolving the issue. She associates the ankle discomfort with tendinitis after consuming sushi.  She takes Tylenol 650 mg for arthritis every eight hours, which provides slight relief. She has not taken any medication, including her blood pressure medication, on the morning of the visit.      Objective:  There were no vitals filed for this visit.  Physical Exam General: AAO x3, NAD  Dermatological: Skin is warm, dry and supple bilateral. There are no open sores, no preulcerative lesions, no rash or signs of infection present.  Vascular: Dorsalis Pedis artery and Posterior Tibial artery pedal pulses are 2/4 bilateral with immedate capillary fill time. There is no pain with calf compression, swelling, warmth, erythema.   Neruologic: Grossly intact via light touch bilateral.   Musculoskeletal: Bunion is present to right foot.  There is localized edema to the forefoot on the right side with slight increase in temperature compared to contralateral extremity but there is no open lesions there is no areas of fluctuation or crepitation.  There is no other signs of  infection noted today.  Tenderness to palpation mostly left first MPJ.  There is no significant pain to the ankle today.  Gait: Unassisted, Nonantalgic.     No images are attached to the encounter.    Results  X-rays obtained reviewed of the right foot.  Increase soft tissue density is present.  Bunion is present but there is no evidence of acute fracture.  Cystic changes present in the first metatarsal head.  Calcaneal spurring is present.   Assessment:   1. Bunion   2. Acute idiopathic gout of right foot      Plan:  Patient was evaluated and treated and all questions answered.  Assessment and Plan Assessment & Plan Right foot pain and swelling, possible gout flare Acute right foot pain and swelling with severe radiating pain. Differential includes joint inflammation or gout flare. - Medrol Dosepak prescribed - Offered steroid injection.  - Check uric acid level    Return in about 3 weeks (around 01/29/2024), or if symptoms worsen or fail to improve.   [name] Fees DPM

## 2024-01-09 ENCOUNTER — Ambulatory Visit: Payer: Self-pay | Admitting: Podiatry

## 2024-01-09 LAB — URIC ACID: Uric Acid: 9.7 mg/dL — ABNORMAL HIGH (ref 3.0–7.2)
